# Patient Record
Sex: Female | Born: 1950
Health system: Southern US, Community
[De-identification: ages and names within clinical notes are randomized; demographics above are authoritative.]

## PROBLEM LIST (undated history)

## (undated) DIAGNOSIS — M4800 Spinal stenosis, site unspecified: Secondary | ICD-10-CM

## (undated) DIAGNOSIS — F32A Depression, unspecified: Secondary | ICD-10-CM

## (undated) DIAGNOSIS — F329 Major depressive disorder, single episode, unspecified: Secondary | ICD-10-CM

## (undated) DIAGNOSIS — M8580 Other specified disorders of bone density and structure, unspecified site: Secondary | ICD-10-CM

## (undated) HISTORY — PX: OTHER SURGICAL HISTORY: SHX169

## (undated) HISTORY — PX: TONSILLECTOMY: SUR1361

## (undated) HISTORY — DX: Major depressive disorder, single episode, unspecified: F32.9

## (undated) HISTORY — DX: Spinal stenosis, site unspecified: M48.00

## (undated) HISTORY — DX: Other specified disorders of bone density and structure, unspecified site: M85.80

## (undated) HISTORY — DX: Depression, unspecified: F32.A

---

## 1999-01-05 ENCOUNTER — Other Ambulatory Visit: Admission: RE | Admit: 1999-01-05 | Discharge: 1999-01-05 | Payer: Self-pay | Admitting: Internal Medicine

## 2000-02-29 ENCOUNTER — Encounter: Admission: RE | Admit: 2000-02-29 | Discharge: 2000-02-29 | Payer: Self-pay | Admitting: Internal Medicine

## 2000-02-29 ENCOUNTER — Encounter: Payer: Self-pay | Admitting: Internal Medicine

## 2001-01-30 ENCOUNTER — Other Ambulatory Visit: Admission: RE | Admit: 2001-01-30 | Discharge: 2001-01-30 | Payer: Self-pay | Admitting: Internal Medicine

## 2001-04-03 ENCOUNTER — Encounter: Payer: Self-pay | Admitting: Internal Medicine

## 2001-04-03 ENCOUNTER — Encounter: Admission: RE | Admit: 2001-04-03 | Discharge: 2001-04-03 | Payer: Self-pay | Admitting: Internal Medicine

## 2002-04-09 ENCOUNTER — Other Ambulatory Visit: Admission: RE | Admit: 2002-04-09 | Discharge: 2002-04-09 | Payer: Self-pay | Admitting: Internal Medicine

## 2002-04-23 ENCOUNTER — Encounter: Payer: Self-pay | Admitting: Internal Medicine

## 2002-04-23 ENCOUNTER — Encounter: Admission: RE | Admit: 2002-04-23 | Discharge: 2002-04-23 | Payer: Self-pay | Admitting: Internal Medicine

## 2002-12-17 ENCOUNTER — Encounter: Admission: RE | Admit: 2002-12-17 | Discharge: 2002-12-17 | Payer: Self-pay | Admitting: Internal Medicine

## 2002-12-17 ENCOUNTER — Encounter: Payer: Self-pay | Admitting: Internal Medicine

## 2003-05-06 ENCOUNTER — Encounter: Admission: RE | Admit: 2003-05-06 | Discharge: 2003-05-06 | Payer: Self-pay | Admitting: Internal Medicine

## 2003-05-06 ENCOUNTER — Other Ambulatory Visit: Admission: RE | Admit: 2003-05-06 | Discharge: 2003-05-06 | Payer: Self-pay | Admitting: Internal Medicine

## 2003-05-06 ENCOUNTER — Encounter: Payer: Self-pay | Admitting: Internal Medicine

## 2003-08-08 ENCOUNTER — Ambulatory Visit (HOSPITAL_COMMUNITY): Admission: RE | Admit: 2003-08-08 | Discharge: 2003-08-08 | Payer: Self-pay | Admitting: Internal Medicine

## 2004-06-08 ENCOUNTER — Encounter: Admission: RE | Admit: 2004-06-08 | Discharge: 2004-06-08 | Payer: Self-pay | Admitting: Internal Medicine

## 2004-06-29 ENCOUNTER — Encounter: Admission: RE | Admit: 2004-06-29 | Discharge: 2004-06-29 | Payer: Self-pay | Admitting: Internal Medicine

## 2005-06-21 ENCOUNTER — Encounter: Admission: RE | Admit: 2005-06-21 | Discharge: 2005-06-21 | Payer: Self-pay | Admitting: Internal Medicine

## 2005-09-06 ENCOUNTER — Other Ambulatory Visit: Admission: RE | Admit: 2005-09-06 | Discharge: 2005-09-06 | Payer: Self-pay | Admitting: Internal Medicine

## 2005-09-13 ENCOUNTER — Ambulatory Visit (HOSPITAL_COMMUNITY): Admission: RE | Admit: 2005-09-13 | Discharge: 2005-09-13 | Payer: Self-pay | Admitting: Internal Medicine

## 2005-10-18 ENCOUNTER — Encounter: Admission: RE | Admit: 2005-10-18 | Discharge: 2005-10-18 | Payer: Self-pay | Admitting: Internal Medicine

## 2006-07-04 ENCOUNTER — Encounter: Admission: RE | Admit: 2006-07-04 | Discharge: 2006-07-04 | Payer: Self-pay | Admitting: Internal Medicine

## 2006-10-24 ENCOUNTER — Other Ambulatory Visit: Admission: RE | Admit: 2006-10-24 | Discharge: 2006-10-24 | Payer: Self-pay | Admitting: Internal Medicine

## 2007-07-24 ENCOUNTER — Encounter: Admission: RE | Admit: 2007-07-24 | Discharge: 2007-07-24 | Payer: Self-pay | Admitting: Internal Medicine

## 2007-11-27 ENCOUNTER — Other Ambulatory Visit: Admission: RE | Admit: 2007-11-27 | Discharge: 2007-11-27 | Payer: Self-pay | Admitting: Internal Medicine

## 2007-12-04 ENCOUNTER — Encounter: Admission: RE | Admit: 2007-12-04 | Discharge: 2007-12-04 | Payer: Self-pay | Admitting: Internal Medicine

## 2008-08-12 ENCOUNTER — Encounter: Admission: RE | Admit: 2008-08-12 | Discharge: 2008-08-12 | Payer: Self-pay | Admitting: Internal Medicine

## 2008-10-28 ENCOUNTER — Ambulatory Visit: Payer: Self-pay | Admitting: Internal Medicine

## 2008-11-30 ENCOUNTER — Emergency Department (HOSPITAL_BASED_OUTPATIENT_CLINIC_OR_DEPARTMENT_OTHER): Admission: EM | Admit: 2008-11-30 | Discharge: 2008-11-30 | Payer: Self-pay | Admitting: Emergency Medicine

## 2008-12-01 ENCOUNTER — Encounter: Admission: RE | Admit: 2008-12-01 | Discharge: 2008-12-01 | Payer: Self-pay | Admitting: Sports Medicine

## 2008-12-01 ENCOUNTER — Emergency Department (HOSPITAL_BASED_OUTPATIENT_CLINIC_OR_DEPARTMENT_OTHER): Admission: EM | Admit: 2008-12-01 | Discharge: 2008-12-01 | Payer: Self-pay | Admitting: Emergency Medicine

## 2008-12-16 ENCOUNTER — Encounter: Admission: RE | Admit: 2008-12-16 | Discharge: 2008-12-16 | Payer: Self-pay | Admitting: Orthopedic Surgery

## 2009-05-19 ENCOUNTER — Ambulatory Visit: Payer: Self-pay | Admitting: Internal Medicine

## 2009-06-09 ENCOUNTER — Other Ambulatory Visit: Admission: RE | Admit: 2009-06-09 | Discharge: 2009-06-09 | Payer: Self-pay | Admitting: Internal Medicine

## 2009-06-09 ENCOUNTER — Ambulatory Visit: Payer: Self-pay | Admitting: Internal Medicine

## 2009-08-18 ENCOUNTER — Encounter: Admission: RE | Admit: 2009-08-18 | Discharge: 2009-08-18 | Payer: Self-pay | Admitting: Internal Medicine

## 2009-12-08 ENCOUNTER — Ambulatory Visit: Payer: Self-pay | Admitting: Internal Medicine

## 2010-08-31 ENCOUNTER — Encounter: Admission: RE | Admit: 2010-08-31 | Discharge: 2010-08-31 | Payer: Self-pay | Admitting: Internal Medicine

## 2010-09-14 ENCOUNTER — Ambulatory Visit: Payer: Self-pay | Admitting: Internal Medicine

## 2010-09-14 ENCOUNTER — Other Ambulatory Visit
Admission: RE | Admit: 2010-09-14 | Discharge: 2010-09-14 | Payer: Self-pay | Source: Home / Self Care | Admitting: Internal Medicine

## 2010-12-20 ENCOUNTER — Encounter: Payer: Self-pay | Admitting: Sports Medicine

## 2011-07-07 ENCOUNTER — Other Ambulatory Visit: Payer: Self-pay | Admitting: Internal Medicine

## 2011-08-31 ENCOUNTER — Other Ambulatory Visit: Payer: Self-pay | Admitting: Internal Medicine

## 2011-08-31 DIAGNOSIS — Z1231 Encounter for screening mammogram for malignant neoplasm of breast: Secondary | ICD-10-CM

## 2011-09-06 ENCOUNTER — Other Ambulatory Visit: Payer: Self-pay | Admitting: *Deleted

## 2011-09-06 MED ORDER — HYDROCODONE-ACETAMINOPHEN 5-500 MG PO TABS: 1.0000 | ORAL_TABLET | Freq: Four times a day (QID) | ORAL | Status: AC | PRN

## 2011-09-17 ENCOUNTER — Encounter: Payer: Self-pay | Admitting: Internal Medicine

## 2011-09-20 ENCOUNTER — Other Ambulatory Visit: Payer: BC Managed Care – HMO | Admitting: Internal Medicine

## 2011-09-20 ENCOUNTER — Ambulatory Visit
Admission: RE | Admit: 2011-09-20 | Discharge: 2011-09-20 | Disposition: A | Discharge status: Home / Self Care | Payer: BC Managed Care – HMO | Source: Ambulatory Visit | Attending: Internal Medicine | Admitting: Internal Medicine

## 2011-09-20 DIAGNOSIS — Z1231 Encounter for screening mammogram for malignant neoplasm of breast: Secondary | ICD-10-CM

## 2011-09-20 DIAGNOSIS — Z0000 Encounter for general adult medical examination without abnormal findings: Secondary | ICD-10-CM

## 2011-09-20 LAB — CBC WITH DIFFERENTIAL/PLATELET
Basophils Absolute: 0 10*3/uL (ref 0.0–0.1)
Basophils Relative: 0 % (ref 0–1)
Eosinophils Absolute: 0.1 10*3/uL (ref 0.0–0.7)
Lymphocytes Relative: 44 % (ref 12–46)
Lymphs Abs: 2.7 10*3/uL (ref 0.7–4.0)
MCH: 31.6 pg (ref 26.0–34.0)
MCHC: 32.5 g/dL (ref 30.0–36.0)
MCV: 97 fL (ref 78.0–100.0)
Neutrophils Relative %: 44 % (ref 43–77)
RBC: 4.34 MIL/uL (ref 3.87–5.11)

## 2011-09-20 LAB — TSH: TSH: 1.245 u[IU]/mL (ref 0.350–4.500)

## 2011-09-21 ENCOUNTER — Ambulatory Visit (INDEPENDENT_AMBULATORY_CARE_PROVIDER_SITE_OTHER): Payer: BC Managed Care – HMO | Admitting: Internal Medicine

## 2011-09-21 ENCOUNTER — Encounter: Payer: Self-pay | Admitting: Internal Medicine

## 2011-09-21 VITALS — BP 140/84 | HR 68 | Resp 18 | Ht 67.0 in | Wt 189.0 lb

## 2011-09-21 DIAGNOSIS — M4800 Spinal stenosis, site unspecified: Secondary | ICD-10-CM

## 2011-09-21 DIAGNOSIS — R232 Flushing: Secondary | ICD-10-CM

## 2011-09-21 DIAGNOSIS — Z87891 Personal history of nicotine dependence: Secondary | ICD-10-CM

## 2011-09-21 DIAGNOSIS — M8580 Other specified disorders of bone density and structure, unspecified site: Secondary | ICD-10-CM

## 2011-09-21 DIAGNOSIS — N951 Menopausal and female climacteric states: Secondary | ICD-10-CM

## 2011-09-21 DIAGNOSIS — M949 Disorder of cartilage, unspecified: Secondary | ICD-10-CM

## 2011-09-21 DIAGNOSIS — Z23 Encounter for immunization: Secondary | ICD-10-CM

## 2011-09-21 DIAGNOSIS — Z0000 Encounter for general adult medical examination without abnormal findings: Secondary | ICD-10-CM

## 2011-09-21 LAB — COMPREHENSIVE METABOLIC PANEL
AST: 20 U/L (ref 0–37)
Albumin: 4.5 g/dL (ref 3.5–5.2)
Alkaline Phosphatase: 42 U/L (ref 39–117)
BUN: 14 mg/dL (ref 6–23)
Calcium: 9.3 mg/dL (ref 8.4–10.5)
Chloride: 109 mEq/L (ref 96–112)
Glucose, Bld: 96 mg/dL (ref 70–99)
Potassium: 4.4 mEq/L (ref 3.5–5.3)
Sodium: 143 mEq/L (ref 135–145)
Total Protein: 6.4 g/dL (ref 6.0–8.3)

## 2011-09-21 LAB — LIPID PANEL
Cholesterol: 187 mg/dL (ref 0–200)
Total CHOL/HDL Ratio: 2.9 Ratio

## 2011-09-21 LAB — POCT URINALYSIS DIPSTICK
Glucose, UA: NEGATIVE
Ketones, UA: NEGATIVE
Leukocytes, UA: NEGATIVE

## 2011-09-21 MED ORDER — TETANUS-DIPHTH-ACELL PERTUSSIS 5-2.5-18.5 LF-MCG/0.5 IM SUSP: 0.5000 mL | Freq: Once | INTRAMUSCULAR | Status: DC

## 2011-09-24 ENCOUNTER — Other Ambulatory Visit: Payer: Self-pay | Admitting: Internal Medicine

## 2011-09-24 DIAGNOSIS — Z780 Asymptomatic menopausal state: Secondary | ICD-10-CM

## 2011-09-27 ENCOUNTER — Encounter: Payer: Self-pay | Admitting: Internal Medicine

## 2011-09-27 ENCOUNTER — Ambulatory Visit (INDEPENDENT_AMBULATORY_CARE_PROVIDER_SITE_OTHER): Payer: BC Managed Care – HMO | Admitting: Internal Medicine

## 2011-09-27 VITALS — BP 108/64 | HR 80 | Temp 98.4°F | Wt 188.0 lb

## 2011-09-27 DIAGNOSIS — M545 Low back pain: Secondary | ICD-10-CM

## 2011-09-27 NOTE — Progress Notes (Signed)
  Subjective:    Patient ID: Natalie Parker, female    DOB: September 25, 1951, 60 y.o.   MRN: [id number]  HPI Patient was away from work last week and spent some time cleaning out some closets. No frank heavy lifting but a fair amount of exercise for a few days. Now complaining of low back pain in the left lower back area for 48 hours. Difficult to get out of bed. Has been taking Vicodin sparingly but has to go back to work tomorrow and is concerned. No radiation of pain down left leg. No weakness in left leg.    Review of Systems     Objective:   Physical Exam straight leg raising is negative at 90 bilaterally. Deep tendon reflexes 1+ and equal in the knees absent in the ankles bilaterally. Muscle strength is 5 over 5 in the lower extremities. Has tenderness over left posterior superior iliac spine.        Assessment & Plan:  Low back strain  Plan: Sterapred DS 10 mg 12 day dosepak. Has Flexeril 10 mg at bedtime to take at bedtime. Has Vicodin 5/500 to take 1 by mouth every 6 hours when necessary pain. Call if not better in 7-10 days

## 2011-09-27 NOTE — Patient Instructions (Signed)
Apply heat or ice to lower back as needed. Take prednisone taper as prescribed for 12 days. Take Flexeril at bedtime and Vicodin sparingly for pain.

## 2011-09-29 ENCOUNTER — Encounter: Payer: Self-pay | Admitting: Internal Medicine

## 2011-09-29 DIAGNOSIS — Z87891 Personal history of nicotine dependence: Secondary | ICD-10-CM | POA: Insufficient documentation

## 2011-09-29 DIAGNOSIS — R232 Flushing: Secondary | ICD-10-CM | POA: Insufficient documentation

## 2011-09-29 DIAGNOSIS — M8580 Other specified disorders of bone density and structure, unspecified site: Secondary | ICD-10-CM | POA: Insufficient documentation

## 2011-09-29 DIAGNOSIS — M4800 Spinal stenosis, site unspecified: Secondary | ICD-10-CM | POA: Insufficient documentation

## 2011-09-29 NOTE — Patient Instructions (Signed)
Return one year or as needed. We will prescribed Vicodin for back pain. Please do not exceed more than 2-4 daily. Please consider stopping smoking.

## 2011-09-29 NOTE — Progress Notes (Signed)
  Subjective:    Patient ID: Natalie Parker, female    DOB: January 24, 1951, 60 y.o.   MRN: [id number]  HPI 60 year old white female here today for health maintenance. History of lumbar back pain, osteopenia, spinal stenosis, hot flashes. 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 Is taking Mobic in the past for back pain and takes Vicodin sparingly. Has Flexeril to take at bedtime as well.  Had tonsillectomy 1974 surgery for right knee meniscal tear April 2010. Last mammogram October 2011. Had colonoscopy 2004. Had hyperplastic polyp. Has taken Effexor for hot flashes and mood stabilization since 2011.  She is married. History of cigarette smoking about a half pack daily. Social alcohol consumption.  Father died at age 86 of a brain tumor. Has 2 brothers. And in and    Review of Systems  Constitutional: Positive for fatigue.  HENT: Negative.   Eyes: Negative.   Respiratory: Negative.   Cardiovascular: Negative.   Gastrointestinal: Negative.   Genitourinary: Negative.   Musculoskeletal: Positive for back pain and arthralgias.  Neurological: Negative.   Hematological: Negative.   Psychiatric/Behavioral: Negative.        Objective:   Physical Exam  Vitals reviewed. Constitutional: She is oriented to person, place, and time. She appears well-developed and well-nourished.  HENT:  Head: Normocephalic and atraumatic.  Right Ear: External ear normal.  Left Ear: External ear normal.  Mouth/Throat: Oropharynx is clear and moist.  Eyes: Conjunctivae and EOM are normal. Pupils are equal, round, and reactive to light. No scleral icterus.  Neck: Neck supple. No JVD present. No thyromegaly present.  Cardiovascular: Normal rate, regular rhythm and normal heart sounds.   Abdominal: Soft. Bowel sounds are normal. She exhibits no mass. There is no tenderness.  Musculoskeletal: Normal range of motion. She exhibits no edema.  Lymphadenopathy:    She has no cervical adenopathy.  Neurological: She  is alert and oriented to person, place, and time. She has normal reflexes. No cranial nerve deficit.  Skin: Skin is warm and dry.  Psychiatric: She has a normal mood and affect. Her behavior is normal.          Assessment & Plan:  History of low back pain and spinal stenosis  Osteopenia  Hot flashes  History of smoking  Plan: Return one year or as needed. Please consider stopping smoking.

## 2011-10-04 ENCOUNTER — Ambulatory Visit
Admission: RE | Admit: 2011-10-04 | Discharge: 2011-10-04 | Disposition: A | Discharge status: Home / Self Care | Payer: BC Managed Care – HMO | Source: Ambulatory Visit | Attending: Internal Medicine | Admitting: Internal Medicine

## 2011-10-04 DIAGNOSIS — Z780 Asymptomatic menopausal state: Secondary | ICD-10-CM

## 2012-01-22 ENCOUNTER — Other Ambulatory Visit: Payer: Self-pay | Admitting: Internal Medicine

## 2012-03-27 ENCOUNTER — Ambulatory Visit (INDEPENDENT_AMBULATORY_CARE_PROVIDER_SITE_OTHER): Payer: BC Managed Care – HMO | Admitting: Internal Medicine

## 2012-03-27 ENCOUNTER — Encounter: Payer: Self-pay | Admitting: Internal Medicine

## 2012-03-27 VITALS — BP 116/66 | HR 60 | Temp 98.4°F | Wt 192.0 lb

## 2012-03-27 DIAGNOSIS — Z87891 Personal history of nicotine dependence: Secondary | ICD-10-CM

## 2012-03-27 DIAGNOSIS — M25559 Pain in unspecified hip: Secondary | ICD-10-CM

## 2012-03-27 DIAGNOSIS — M48061 Spinal stenosis, lumbar region without neurogenic claudication: Secondary | ICD-10-CM

## 2012-03-27 DIAGNOSIS — M25551 Pain in right hip: Secondary | ICD-10-CM

## 2012-03-27 DIAGNOSIS — M899 Disorder of bone, unspecified: Secondary | ICD-10-CM

## 2012-03-27 DIAGNOSIS — M169 Osteoarthritis of hip, unspecified: Secondary | ICD-10-CM

## 2012-03-27 DIAGNOSIS — M8580 Other specified disorders of bone density and structure, unspecified site: Secondary | ICD-10-CM

## 2012-03-27 DIAGNOSIS — M160 Bilateral primary osteoarthritis of hip: Secondary | ICD-10-CM

## 2012-03-27 NOTE — Progress Notes (Signed)
  Subjective:    Patient ID: Comer Locket, female    DOB: 09/28/1951, 61 y.o.   MRN: [id number]  HPI For 6 month recheck. Has quit smoking as of April 2nd. Was smoking a pack q 3 days. Has started new diet. Back pain is stable. c/o bilateral hip pain. History of osteopenia and spinal stenosis. 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 Says both hips are hurting by the end of the day and this is concerning to her. Thinks she may have arthritis.    Review of Systems     Objective:   Physical Exam straight leg raising slightly positive on the left at 90 negative on the right. Muscle strength in the lower extremities is normal and deep tendon reflexes 2+ in the knees. Patient has some pain with internal rotation of both hips.        Assessment & Plan:  History of lumbar spinal stenosis  Osteoarthritis of hips  Plan: Continue with Vicodin sparingly for pain and have refilled Vicodin 5/500 #61 by mouth every 12 hours with 3 refills. Continue with calcium and vitamin D supplement for osteopenia. Bone density report November 2012 reviewed with her showed osteopenia. Return in 6 months for physical exam. Consider hip x-ray. May benefit from physical therapy.

## 2012-03-29 ENCOUNTER — Encounter: Payer: Self-pay | Admitting: Internal Medicine

## 2012-03-29 DIAGNOSIS — M25552 Pain in left hip: Secondary | ICD-10-CM | POA: Insufficient documentation

## 2012-03-29 NOTE — Patient Instructions (Signed)
Continue hydrocodone sparingly for musculoskeletal pain. Return in 6 months for physical exam. Continue calcium and vitamin D for osteopenia.

## 2012-06-15 ENCOUNTER — Ambulatory Visit (INDEPENDENT_AMBULATORY_CARE_PROVIDER_SITE_OTHER): Payer: BC Managed Care – HMO | Admitting: Internal Medicine

## 2012-06-15 ENCOUNTER — Encounter: Payer: Self-pay | Admitting: Internal Medicine

## 2012-06-15 VITALS — BP 122/76 | HR 64 | Temp 98.3°F | Ht 67.0 in | Wt 194.0 lb

## 2012-06-15 DIAGNOSIS — L02239 Carbuncle of trunk, unspecified: Secondary | ICD-10-CM

## 2012-06-19 ENCOUNTER — Ambulatory Visit: Payer: BC Managed Care – HMO | Admitting: Internal Medicine

## 2012-06-19 NOTE — Patient Instructions (Addendum)
Keep area clean warm and dry. Take doxycycline 100 mg twice daily for 10 days. Use warm hot compresses for 20 minutes twice daily on area. Call if not better in 3 days.

## 2012-06-19 NOTE — Progress Notes (Signed)
  Subjective:    Patient ID: Natalie Parker, female    DOB: 02-21-1951, 61 y.o.   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 Has not seen any drainage. She thought it looked a bit like a pimple. She was afraid to squeeze it. Says she's never had anything like this previously. No fever or malaise.    Review of Systems     Objective:   Physical Exam small erythematous nodule about the size of a pea below left breast. Pus was expressed from the area.        Assessment & Plan:  Carbuncle-trunk  Plan: Recommend warm hot compresses 20 minutes twice daily. Prescribed doxycycline 100 mg twice daily for 10 days. Call if not better in 3 days.

## 2012-08-21 ENCOUNTER — Ambulatory Visit (INDEPENDENT_AMBULATORY_CARE_PROVIDER_SITE_OTHER): Payer: BC Managed Care – HMO | Admitting: Internal Medicine

## 2012-08-21 ENCOUNTER — Encounter: Payer: Self-pay | Admitting: Internal Medicine

## 2012-08-21 VITALS — BP 110/58 | HR 76 | Temp 98.0°F | Ht 67.5 in | Wt 193.0 lb

## 2012-08-21 DIAGNOSIS — H6593 Unspecified nonsuppurative otitis media, bilateral: Secondary | ICD-10-CM

## 2012-08-21 DIAGNOSIS — J329 Chronic sinusitis, unspecified: Secondary | ICD-10-CM

## 2012-08-21 DIAGNOSIS — H6590 Unspecified nonsuppurative otitis media, unspecified ear: Secondary | ICD-10-CM

## 2012-08-21 NOTE — Progress Notes (Signed)
  Subjective:    Patient ID: Comer Locket, female    DOB: [birth year], 61 y.o.   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 Has nasal drainage that is not discolored. Some cough. Difficulty sleeping at night because of cough and congestion. Only slight sore throat. Has wedding to attend this coming weekend and  needs to get a lot of things done before then. No fever or shaking chills. No discolored sputum. Has taken Mucinex and Sudafed.      Review of Systems     Objective:   Physical Exam HEENT exam: TMs are full bilaterally but not red. Pharynx is slightly injected without exudate. Neck is supple without adenopathy. Chest clear to auscultation. Sounds slightly nasally congested. Skin warm and dry        Assessment & Plan:  Sinusitis  Bilateral serous otitis media  Plan: Hycodan 8 ounces 1 teaspoon by mouth every 6 hours when necessary cough. Levaquin 500 milligrams ( # 7) 1 by mouth daily with a meal with 1 refill. If not better in one week have prescription refilled.

## 2012-08-21 NOTE — Patient Instructions (Addendum)
Take Levaquin 500 milligrams daily for 7 days. If not better in 7 days have prescription refilled. Take Hycodan 1 teaspoon every 6 hours as needed for cough

## 2012-08-22 ENCOUNTER — Ambulatory Visit: Payer: BC Managed Care – HMO | Admitting: Internal Medicine

## 2012-09-25 ENCOUNTER — Other Ambulatory Visit: Payer: Self-pay | Admitting: Internal Medicine

## 2012-09-25 ENCOUNTER — Encounter: Payer: BC Managed Care – HMO | Admitting: Internal Medicine

## 2012-09-25 ENCOUNTER — Other Ambulatory Visit: Payer: BC Managed Care – HMO | Admitting: Internal Medicine

## 2012-09-25 DIAGNOSIS — Z1231 Encounter for screening mammogram for malignant neoplasm of breast: Secondary | ICD-10-CM

## 2012-10-09 ENCOUNTER — Encounter: Payer: Self-pay | Admitting: Internal Medicine

## 2012-10-09 ENCOUNTER — Other Ambulatory Visit (INDEPENDENT_AMBULATORY_CARE_PROVIDER_SITE_OTHER): Payer: BC Managed Care – HMO | Admitting: Internal Medicine

## 2012-10-09 ENCOUNTER — Ambulatory Visit (INDEPENDENT_AMBULATORY_CARE_PROVIDER_SITE_OTHER): Payer: BC Managed Care – HMO | Admitting: Internal Medicine

## 2012-10-09 VITALS — BP 102/72 | HR 68 | Temp 97.0°F | Ht 67.5 in | Wt 198.0 lb

## 2012-10-09 DIAGNOSIS — M19019 Primary osteoarthritis, unspecified shoulder: Secondary | ICD-10-CM

## 2012-10-09 DIAGNOSIS — Z87891 Personal history of nicotine dependence: Secondary | ICD-10-CM

## 2012-10-09 DIAGNOSIS — M899 Disorder of bone, unspecified: Secondary | ICD-10-CM

## 2012-10-09 DIAGNOSIS — Z0000 Encounter for general adult medical examination without abnormal findings: Secondary | ICD-10-CM

## 2012-10-09 DIAGNOSIS — Z23 Encounter for immunization: Secondary | ICD-10-CM

## 2012-10-09 DIAGNOSIS — G8929 Other chronic pain: Secondary | ICD-10-CM

## 2012-10-09 DIAGNOSIS — M7711 Lateral epicondylitis, right elbow: Secondary | ICD-10-CM

## 2012-10-09 DIAGNOSIS — M8580 Other specified disorders of bone density and structure, unspecified site: Secondary | ICD-10-CM

## 2012-10-09 DIAGNOSIS — F329 Major depressive disorder, single episode, unspecified: Secondary | ICD-10-CM

## 2012-10-09 DIAGNOSIS — M545 Low back pain: Secondary | ICD-10-CM

## 2012-10-09 DIAGNOSIS — M949 Disorder of cartilage, unspecified: Secondary | ICD-10-CM

## 2012-10-09 DIAGNOSIS — G4700 Insomnia, unspecified: Secondary | ICD-10-CM

## 2012-10-09 LAB — CBC WITH DIFFERENTIAL/PLATELET
Lymphocytes Relative: 46 % (ref 12–46)
Lymphs Abs: 2.1 10*3/uL (ref 0.7–4.0)
Neutro Abs: 1.9 10*3/uL (ref 1.7–7.7)
Neutrophils Relative %: 41 % — ABNORMAL LOW (ref 43–77)
Platelets: 242 10*3/uL (ref 150–400)
RBC: 4.25 MIL/uL (ref 3.87–5.11)
WBC: 4.5 10*3/uL (ref 4.0–10.5)

## 2012-10-09 LAB — LIPID PANEL
Cholesterol: 185 mg/dL (ref 0–200)
VLDL: 14 mg/dL (ref 0–40)

## 2012-10-09 LAB — COMPREHENSIVE METABOLIC PANEL
ALT: 16 U/L (ref 0–35)
AST: 21 U/L (ref 0–37)
CO2: 28 mEq/L (ref 19–32)
Calcium: 9.3 mg/dL (ref 8.4–10.5)
Chloride: 104 mEq/L (ref 96–112)
Sodium: 142 mEq/L (ref 135–145)
Total Protein: 6.5 g/dL (ref 6.0–8.3)

## 2012-10-09 LAB — POCT URINALYSIS DIPSTICK
Bilirubin, UA: NEGATIVE
Glucose, UA: NEGATIVE
Leukocytes, UA: NEGATIVE
Nitrite, UA: NEGATIVE
Urobilinogen, UA: NEGATIVE
pH, UA: 7.5

## 2012-10-09 LAB — TSH: TSH: 1.294 u[IU]/mL (ref 0.350–4.500)

## 2012-10-09 NOTE — Progress Notes (Signed)
Subjective:    Patient ID: Natalie Parker, female    DOB: 01-08-1951, 61 y.o.   MRN: [id number]  HPI  61 year-old white female comes in today for health maintenance and evaluation of several medical problems. History of lumbar back pain, osteopenia, spinal stenosis, hot flashes. 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 He problems include right elbow and shoulder pain. She uses blow dryer with her right hand but is actually left-handed. Also has had considerable issues with hot flashes and insomnia. She tried Effexor but says it causes a headache. She has taken Flexeril at bedtime previously. She is on Mobic for musculoskeletal pain. She takes Vicodin sparingly.  Says she is depressed. Says she's irritable. Says these issues around her not feeling well and     hurting. 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 Doesn't plan to retire for couple of years. We took her off of estrogen replacement because she had been on it for many years. Says that she has a lot of issues with hot flashes and insomnia. Says she's been fatigued and worried. Will not say exactly why she is worried other than pain and getting old  History of smoking cigarettes half pack daily. Social alcohol consumption.  Father died at age 46 of a brain tumor. Has 2 brothers in good health. No children.  Tonsillectomy 1974. Surgery for right knee meniscal tear April 2010. Gets annual mammogram. History of osteopenia. Had colonoscopy by Dr. Juanda Chance in 2004. History of hyperplastic polyps. Review of Systems  Constitutional: Positive for fatigue.  HENT: Negative.   Eyes: Negative.   Genitourinary:       Hot flashes  Musculoskeletal: Positive for back pain.       Right elbow and right shoulder benign  Neurological: Negative.   Psychiatric/Behavioral: Positive for sleep disturbance and dysphoric mood.       Objective:   Physical Exam  Vitals reviewed. Constitutional: She is oriented to person, place, and time. She appears  well-developed and well-nourished. No distress.  HENT:  Head: Normocephalic and atraumatic.  Right Ear: External ear normal.  Left Ear: External ear normal.  Mouth/Throat: Oropharynx is clear and moist. No oropharyngeal exudate.  Eyes: Conjunctivae normal and EOM are normal. Pupils are equal, round, and reactive to light. Right eye exhibits no discharge. Left eye exhibits no discharge.  Neck: Neck supple. No JVD present. No thyromegaly present.  Cardiovascular: Normal rate, regular rhythm and normal heart sounds.   No murmur heard. Pulmonary/Chest: Effort normal and breath sounds normal. No respiratory distress. She has no wheezes. She has no rales.       Breasts normal female without masses  Abdominal: Soft. Bowel sounds are normal. She exhibits no distension and no mass. There is no tenderness. There is no rebound and no guarding.  Genitourinary:       Pap not taken. Bimanual exam normal  Musculoskeletal: Normal range of motion. She exhibits no edema.       Tender right lateral epicondyle. Tender anterior glenohumeral joint on right. Normal muscle strength in the right upper extremity and deep tendon reflexes in the right upper extremity are normal  Lymphadenopathy:    She has no cervical adenopathy.  Neurological: She is alert and oriented to person, place, and time. She has normal reflexes. No cranial nerve deficit. Coordination normal.  Skin: Skin is warm and dry. No rash noted. She is not diaphoretic.  Psychiatric: Judgment and thought content normal.  Tearful in the office. Affect is flat.          Assessment & Plan:   long-standing history of back pain related to spinal stenosis  Right lateral epicondylitis  Right shoulder arthropathy  Depression  Hot flashes  Insomnia  Plan: Prempro 0.625 mg/5 mg daily. Start Celexa 20 mg daily. Return in 4 weeks for reevaluation. Fasting labs are drawn and are pending including TSH. Continue Vicodin sparingly for pain. She is  on Mobic for musculoskeletal pain. Appointment with hand surgeon to look at right upper extremity. Is not ready to quit smoking.

## 2012-10-10 ENCOUNTER — Encounter: Payer: Self-pay | Admitting: Internal Medicine

## 2012-10-10 DIAGNOSIS — F329 Major depressive disorder, single episode, unspecified: Secondary | ICD-10-CM | POA: Insufficient documentation

## 2012-10-10 DIAGNOSIS — G4700 Insomnia, unspecified: Secondary | ICD-10-CM | POA: Insufficient documentation

## 2012-10-10 LAB — VITAMIN D 25 HYDROXY (VIT D DEFICIENCY, FRACTURES): Vit D, 25-Hydroxy: 28 ng/mL — ABNORMAL LOW (ref 30–89)

## 2012-10-10 NOTE — Patient Instructions (Addendum)
Start Celexa 20 mg daily. Start Prempro for estrogen replacement. Return in 4 weeks. See hand surgeon for right shoulder and right arm pain.

## 2012-10-10 NOTE — Patient Instructions (Addendum)
Takes Celexa 20 mg daily. Takes Prempro daily. Return in 4 weeks. See hand surgeon for shoulder problems and arm pain

## 2012-10-10 NOTE — Progress Notes (Signed)
Subjective:    Patient ID: Natalie Parker, female    DOB: 02-May-1951, 61 y.o.   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 Used to take Prempro and Activella but I got her to discontinue those a few years ago. She took Fosamax for 2 years but discontinued in 2011. Had bone density study in 2006 that showed a T score in the LS spine of -1.5 and a T score in the left hip of -0.1. Last mammogram on file October 2011. Patient takes Mobic for musculoskeletal pain. She also takes Vicodin sparingly for pain. She had surgery for right meniscal tear in April 2010. Tonsillectomy 1974.  She has been a Interior and spatial designer for 39 years. Doesn't want to retire for another couple of years. Has no children. Is married. Completed the 12th grade. Has smoked a half-pack of cigarettes a day for over 30 years. Is on her feet a number of hours a week. Had a D&C status post miscarriage in November 1994.  Patient admits to being depressed. Says she worries about growing older. Social alcohol consumption.   As did the S1 selective nerve root block on the right in September 2011 and August 2010  Family history: Mother is deceased. Father died at age 68 of a brain tumor. 2 brothers in good health.  Patient saw GYN physician in 2009 for an episode of uterine bleeding while on Activella. She discontinued hormone replacement therapy. Endometrial biopsy was negative. Ultrasound was negative.  Patient says she is suffering from insomnia and hot flashes. We'll to go back on estrogen replacement., Bit reluctant about this but we're going to try it for a couple of months.  She's having considerable right arm and shoulder pain. She is left handed but uses a blow dryer with her right upper extremity. Says it's difficult to use a blow dryer because of pain in her right lateral epicondyle and right  shoulder.    Review of Systems  HENT: Negative.   Eyes: Negative.   Respiratory: Negative.   Genitourinary: Negative.   Musculoskeletal: Positive for back pain and arthralgias.       Right shoulder pain, right elbow pain  Neurological: Negative.   Hematological: Negative.   Psychiatric/Behavioral: Positive for dysphoric mood.       Objective:   Physical Exam  Nursing note and vitals reviewed. Constitutional: She is oriented to person, place, and time. She appears well-developed and well-nourished. No distress.  HENT:  Head: Normocephalic and atraumatic.  Right Ear: External ear normal.  Left Ear: External ear normal.  Mouth/Throat: Oropharynx is clear and moist. No oropharyngeal exudate.  Eyes: Conjunctivae normal are normal. Pupils are equal, round, and reactive to light. Right eye exhibits no discharge. Left eye exhibits no discharge. No scleral icterus.  Neck: Neck supple. No JVD present. No thyromegaly present.  Cardiovascular: Normal rate, regular rhythm, normal heart sounds and intact distal pulses.   No murmur heard. Pulmonary/Chest: Effort normal and breath sounds normal. No respiratory distress. She has no wheezes. She has no rales. She exhibits no tenderness.       Breasts normal female  Abdominal: Soft. Bowel sounds are normal. She exhibits no distension and no mass. There is no tenderness. There is no rebound and no guarding.  Genitourinary:       Bimanual exam normal. Last Pap smear 09/15/2010  Musculoskeletal: Normal range of motion. She exhibits no  edema.       Tender right shoulder and right lateral epicondyle  Lymphadenopathy:    She has no cervical adenopathy.  Neurological: She is alert and oriented to person, place, and time. She displays normal reflexes. No cranial nerve deficit. Coordination normal.  Skin: Skin is warm and dry. She is not diaphoretic.  Psychiatric:       Flat affect          Assessment & Plan:   Right lateral  epicondylitis  Right shoulder arthropathy  Depression  Hot flashes  Insomnia  History of chronic back pain secondary spinal stenosis  Osteopenia  History smoking  Plan: Patient will start Celexa 20 mg daily. We'll start Prempro 0.625 mg/5 mg daily. Reevaluate in 4-6 weeks. She'll see hand surgeon regarding shoulder pain and elbow pain.  Fasting labs drawn and are pending including TSH. Needs to have mammogram. May need GYN consultation regarding estrogen replacement.

## 2012-10-12 ENCOUNTER — Telehealth: Payer: Self-pay

## 2012-10-12 NOTE — Telephone Encounter (Signed)
Patient scheduled for an appointment with Dr. Amanda Pea on November 30, 2012 at 8:15 am. Notified of this

## 2012-10-30 ENCOUNTER — Ambulatory Visit
Admission: RE | Admit: 2012-10-30 | Discharge: 2012-10-30 | Disposition: A | Discharge status: Home / Self Care | Payer: BC Managed Care – PPO | Source: Ambulatory Visit | Attending: Internal Medicine | Admitting: Internal Medicine

## 2012-10-30 DIAGNOSIS — Z1231 Encounter for screening mammogram for malignant neoplasm of breast: Secondary | ICD-10-CM

## 2012-11-06 ENCOUNTER — Other Ambulatory Visit: Payer: Self-pay | Admitting: Internal Medicine

## 2012-11-06 ENCOUNTER — Other Ambulatory Visit: Payer: Self-pay

## 2012-11-13 ENCOUNTER — Encounter: Payer: Self-pay | Admitting: Internal Medicine

## 2012-11-13 ENCOUNTER — Ambulatory Visit (INDEPENDENT_AMBULATORY_CARE_PROVIDER_SITE_OTHER): Payer: BC Managed Care – PPO | Admitting: Internal Medicine

## 2012-11-13 VITALS — BP 98/62 | HR 72 | Temp 98.6°F | Wt 201.0 lb

## 2012-11-13 DIAGNOSIS — M7918 Myalgia, other site: Secondary | ICD-10-CM

## 2012-11-13 DIAGNOSIS — R232 Flushing: Secondary | ICD-10-CM

## 2012-11-13 DIAGNOSIS — N951 Menopausal and female climacteric states: Secondary | ICD-10-CM

## 2012-11-13 DIAGNOSIS — IMO0001 Reserved for inherently not codable concepts without codable children: Secondary | ICD-10-CM

## 2012-11-13 DIAGNOSIS — F329 Major depressive disorder, single episode, unspecified: Secondary | ICD-10-CM

## 2012-11-13 NOTE — Patient Instructions (Addendum)
Continue Celexa and Prempro. Take Vicodin sparingly. Return in 6 months

## 2012-11-13 NOTE — Progress Notes (Signed)
  Subjective:    Patient ID: Natalie Parker, female    DOB: November 03, 1951, 61 y.o.   MRN: [id number]  HPI One-month followup on depression, musculoskeletal pain and estrogen replacement. Last visit was started on Prempro. So far doing well with that. Feels better. Celexa has helped depression symptoms. Still has musculoskeletal pain in her shoulders. Has Vicodin on hand to take for that. 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 Overall she is more cheerful and less irritable.    Review of Systems     Objective:   Physical Exam spoke with patient today for about 15 minutes. Not examined. Affect is much brighter.        Assessment & Plan:  Depression-improved on Celexa  Vasomotor symptoms consistent with menopause-improved with Prempro  Fatigue  Musculoskeletal pain-treated with Vicodin sparingly  Plan: Return as needed or in 6 months

## 2013-01-15 ENCOUNTER — Other Ambulatory Visit: Payer: Self-pay | Admitting: Internal Medicine

## 2013-03-11 ENCOUNTER — Other Ambulatory Visit: Payer: Self-pay | Admitting: Internal Medicine

## 2013-03-11 NOTE — Telephone Encounter (Signed)
Refill for 6 months. 

## 2013-03-12 ENCOUNTER — Other Ambulatory Visit: Payer: Self-pay

## 2013-03-19 ENCOUNTER — Encounter: Payer: Self-pay | Admitting: Internal Medicine

## 2013-03-19 ENCOUNTER — Ambulatory Visit (INDEPENDENT_AMBULATORY_CARE_PROVIDER_SITE_OTHER): Payer: BC Managed Care – PPO | Admitting: Internal Medicine

## 2013-03-19 VITALS — BP 106/74 | HR 72 | Temp 98.4°F | Wt 202.0 lb

## 2013-03-19 DIAGNOSIS — IMO0002 Reserved for concepts with insufficient information to code with codable children: Secondary | ICD-10-CM

## 2013-03-19 DIAGNOSIS — M5416 Radiculopathy, lumbar region: Secondary | ICD-10-CM

## 2013-03-19 MED ORDER — HYDROCODONE-ACETAMINOPHEN 5-325 MG PO TABS: 1.0000 | ORAL_TABLET | Freq: Three times a day (TID) | ORAL | Status: AC | PRN

## 2013-03-19 NOTE — Progress Notes (Signed)
  Subjective:    Patient ID: Natalie Parker, female    DOB: 10-Apr-1951, 62 y.o.   MRN: [id number]  HPI  62 year old white female with history of right lumbar radiculopathy. Last MRI on file 2010. At that time she had nerve root encroachment L2-L3 L3-L4 and L4-L5 on the right. Has been having recurrent back pain for about a week. Has seen Dr. Ethelene Hal in the past  at [facility] for epidural steroid injections. However hasn't been there in about 16 months and they would not see her without referral from primary care. We will arrange for her to see Dr. Ethelene Hal. Having pain in right buttock radiating down to right leg and knee. 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 Says pain medication was not refilled at [facility] although our records indicate hydrocodone/APAP was refilled on  April 14. Has been taking up to 3 hydrocodone tablets daily because of pain. Is also on Mobic. Has run out of Flexeril.      Review of Systems     Objective:   Physical Exam  Straight leg raising is positive at 90 on the right. Muscle strength is normal in the right lower extremity. Has pain of the right posterior superior iliac spine. Deep tendon reflexes in the knees are absent. Patient is in considerable amount of pain and walks with a limp.        Assessment & Plan:  Right lower extremity radiculopathy  History of lumbar disc protrusions causing nerve root encroachment at multiple levels based on MRI 2010  Plan: Continue generic Mobic. Take Flexeril 10 mg at bedtime, Sterapred DS 10 mg 6 day dosepak. Appointment with Dr. Ethelene Hal. Refill hydrocodone/APAP 5/325 #90 with with 3 refills. She is to take 1 by mouth 3 times a day for now and then cut back gradually. We'll see Dr. Ethelene Hal Monday Apr 03, 2063.

## 2013-03-19 NOTE — Patient Instructions (Addendum)
Take Hydrocodone APAP 3 times daily. Take Prednisone as directed. We will get appt with Dr. Ethelene Hal.  Appt is Monday may 5 with Dr. Ethelene Hal.

## 2013-03-27 ENCOUNTER — Telehealth: Payer: Self-pay

## 2013-03-27 NOTE — Telephone Encounter (Signed)
Ask her to call Natalie Parker at [phone] to be seen acutely by Ortho since Dr. Ethelene Hal does not have appt until May 5th.. Perhaps they can see her and order any tests they think are appropriate.

## 2013-03-27 NOTE — Telephone Encounter (Signed)
Patient informed. 

## 2013-03-27 NOTE — Telephone Encounter (Signed)
Was seen here last week for back pain. On Prednisone dosepak and Vicodin, but says pain med not touching the pain. Has an appointment with an orthopedic May 5, but is hurting bad enough that she is considering going to the ED. Feels like a pinched nerve

## 2013-04-02 ENCOUNTER — Other Ambulatory Visit: Payer: Self-pay | Admitting: Internal Medicine

## 2013-04-14 ENCOUNTER — Other Ambulatory Visit: Payer: Self-pay | Admitting: Internal Medicine

## 2013-05-07 ENCOUNTER — Other Ambulatory Visit: Payer: Self-pay | Admitting: Internal Medicine

## 2013-05-18 ENCOUNTER — Encounter (HOSPITAL_BASED_OUTPATIENT_CLINIC_OR_DEPARTMENT_OTHER): Payer: Self-pay | Admitting: Emergency Medicine

## 2013-05-18 ENCOUNTER — Emergency Department (HOSPITAL_BASED_OUTPATIENT_CLINIC_OR_DEPARTMENT_OTHER)
Admission: EM | Admit: 2013-05-18 | Discharge: 2013-05-18 | Disposition: A | Discharge status: Home / Self Care | Payer: BC Managed Care – PPO | Attending: Emergency Medicine | Admitting: Emergency Medicine

## 2013-05-18 DIAGNOSIS — Z87891 Personal history of nicotine dependence: Secondary | ICD-10-CM | POA: Insufficient documentation

## 2013-05-18 DIAGNOSIS — F3289 Other specified depressive episodes: Secondary | ICD-10-CM | POA: Insufficient documentation

## 2013-05-18 DIAGNOSIS — M5416 Radiculopathy, lumbar region: Secondary | ICD-10-CM

## 2013-05-18 DIAGNOSIS — F329 Major depressive disorder, single episode, unspecified: Secondary | ICD-10-CM | POA: Insufficient documentation

## 2013-05-18 DIAGNOSIS — M899 Disorder of bone, unspecified: Secondary | ICD-10-CM | POA: Insufficient documentation

## 2013-05-18 DIAGNOSIS — Z8739 Personal history of other diseases of the musculoskeletal system and connective tissue: Secondary | ICD-10-CM | POA: Insufficient documentation

## 2013-05-18 DIAGNOSIS — Z79899 Other long term (current) drug therapy: Secondary | ICD-10-CM | POA: Insufficient documentation

## 2013-05-18 DIAGNOSIS — IMO0002 Reserved for concepts with insufficient information to code with codable children: Secondary | ICD-10-CM | POA: Insufficient documentation

## 2013-05-18 MED ORDER — DEXAMETHASONE SODIUM PHOSPHATE 10 MG/ML IJ SOLN
10.0000 mg | Freq: Once | INTRAMUSCULAR | Status: AC
  Administered 2013-05-18: 10 mg via INTRAMUSCULAR
  Filled 2013-05-18: qty 1

## 2013-05-18 MED ORDER — METHOCARBAMOL 750 MG PO TABS: 750.0000 mg | ORAL_TABLET | Freq: Three times a day (TID) | ORAL | Status: DC

## 2013-05-18 MED ORDER — KETOROLAC TROMETHAMINE 60 MG/2ML IM SOLN
60.0000 mg | Freq: Once | INTRAMUSCULAR | Status: AC
  Administered 2013-05-18: 60 mg via INTRAMUSCULAR
  Filled 2013-05-18: qty 2

## 2013-05-18 MED ORDER — OXYCODONE-ACETAMINOPHEN 5-325 MG PO TABS
2.0000 | ORAL_TABLET | Freq: Once | ORAL | Status: AC
  Administered 2013-05-18: 2 via ORAL
  Filled 2013-05-18 (×2): qty 2

## 2013-05-18 MED ORDER — GABAPENTIN 100 MG PO CAPS: 100.0000 mg | ORAL_CAPSULE | Freq: Three times a day (TID) | ORAL | Status: DC

## 2013-05-18 MED ORDER — PREDNISONE 20 MG PO TABS: ORAL_TABLET | ORAL | Status: DC

## 2013-05-18 MED ORDER — METHOCARBAMOL 500 MG PO TABS
1000.0000 mg | ORAL_TABLET | Freq: Once | ORAL | Status: AC
  Administered 2013-05-18: 1000 mg via ORAL
  Filled 2013-05-18: qty 2

## 2013-05-18 NOTE — ED Provider Notes (Signed)
History     CSN: [id number]  Arrival date & time 05/18/13  4098   First MD Initiated Contact with Patient 05/18/13 [phone]      Chief Complaint  Patient presents with  . Back Pain    (Consider location/radiation/quality/duration/timing/severity/associated sxs/prior treatment) Patient is a 62 y.o. female presenting with back pain. The history is provided by the patient. No language interpreter was used.  Back Pain Location:  Lumbar spine Quality:  Shooting Radiates to:  R posterior upper leg Pain severity:  Severe Pain is:  Same all the time Onset quality:  Gradual Timing:  Constant Progression:  Worsening Chronicity:  Recurrent Context: not recent injury   Relieved by:  Nothing Worsened by:  Nothing tried Ineffective treatments:  None tried Associated symptoms: no abdominal pain, no abdominal swelling, no bladder incontinence, no bowel incontinence, no chest pain, no dysuria, no fever, no headaches, no numbness, no paresthesias, no pelvic pain, no perianal numbness, no tingling, no weakness and no weight loss   Risk factors: no vascular disease   Patient reports being seen by Dr. Ethelene Hal on 04/06/13 for epidural which initially worked and patient is now having increased pain again.  No f/c/r.  No trauma.  States that the norco prescribed by her family doctor have not been working.  States she had recent plain films by Dr. Ethelene Hal' PA and those were reportedly normal.    Past Medical History  Diagnosis Date  . Depression   . Osteopenia   . Spinal stenosis     Past Surgical History  Procedure Laterality Date  . Tonsillectomy    . Repair torn meniscus      History reviewed. No pertinent family history.  History  Substance Use Topics  . Smoking status: Former Smoker -- 0.50 packs/day for 20 years    Types: Cigarettes    Quit date: 02/29/2012  . Smokeless tobacco: Never Used  . Alcohol Use: Yes     Comment: occasional    OB History   Grav Para Term Preterm Abortions TAB  SAB Ect Mult Living                  Review of Systems  Constitutional: Negative for fever and weight loss.  Cardiovascular: Negative for chest pain.  Gastrointestinal: Negative for abdominal pain and bowel incontinence.  Genitourinary: Negative for bladder incontinence, dysuria, difficulty urinating and pelvic pain.  Musculoskeletal: Positive for back pain.  Neurological: Negative for tingling, weakness, numbness, headaches and paresthesias.  All other systems reviewed and are negative.    Allergies  E-mycin  Home Medications   Current Outpatient Rx  Name  Route  Sig  Dispense  Refill  . citalopram (CELEXA) 20 MG tablet      TAKE 1 TABLET EVERY DAY   30 tablet   2   . cyclobenzaprine (FLEXERIL) 10 MG tablet   Oral   Take 10 mg by mouth 3 (three) times daily as needed.           Marland Kitchen HYDROcodone-acetaminophen (NORCO/VICODIN) 5-325 MG per tablet   Oral   Take 1 tablet by mouth every 8 (eight) hours as needed for pain.   90 tablet   3   . meloxicam (MOBIC) 15 MG tablet      TAKE 1 TABLET BY MOUTH EVERY DAY   30 tablet   5   . oxyCODONE-acetaminophen (PERCOCET) 10-325 MG per tablet   Oral   Take 1 tablet by mouth every 4 (four) hours as needed  for pain.         . calcium carbonate (TUMS EX) 750 MG chewable tablet   Oral   Chew 1 tablet by mouth daily.           . cyanocobalamin 100 MCG tablet   Oral   Take 100 mcg by mouth daily.           Marland Kitchen HYDROcodone-homatropine (HYCODAN) 5-1.5 MG/5ML syrup               . Multiple Vitamin (MULTIVITAMIN) tablet   Oral   Take 1 tablet by mouth daily.           Marland Kitchen PREMPRO 0.625-5 MG per tablet      TAKE 1 TABLET EVERY DAY   28 tablet   5     BP 115/60  Pulse 69  Resp 18  SpO2 99%  Physical Exam  Constitutional: She is oriented to person, place, and time. She appears well-developed and well-nourished. No distress.  HENT:  Head: Normocephalic and atraumatic.  Mouth/Throat: Oropharynx is clear and  moist.  Eyes: Conjunctivae are normal. Pupils are equal, round, and reactive to light.  Neck: Normal range of motion. Neck supple.  Cardiovascular: Normal rate, regular rhythm and intact distal pulses.   Pulmonary/Chest: Effort normal and breath sounds normal. She has no wheezes. She has no rales.  Abdominal: Soft. Bowel sounds are normal. There is no tenderness. There is no rebound and no guarding.  Musculoskeletal: Normal range of motion. She exhibits no edema and no tenderness.  Neurological: She is alert and oriented to person, place, and time. She has normal reflexes.  Intact L5/s1   Skin: Skin is warm and dry.  Psychiatric: She has a normal mood and affect.    ED Course  Procedures (including critical care time)  Labs Reviewed - No data to display No results found.   No diagnosis found.    MDM  IM toradol, dexamethasone, percocet and robaxin 1000 mg PO.  Will change muscle relaxant to robaxin BID, and prescribe short course of oral steroids.  Patient currently has prescriptions for meloxicam and norco.  Follow up with Dr. Ethelene Hal for close follow up.         Jasmine Awe, MD 05/18/13 [phone]

## 2013-05-18 NOTE — ED Notes (Signed)
H/o back pain, had epidural injection may 9th, pain resolved, in last 48 hr pt has developed severe pain radiating down right leg

## 2013-08-08 ENCOUNTER — Other Ambulatory Visit: Payer: Self-pay | Admitting: Internal Medicine

## 2013-08-09 ENCOUNTER — Other Ambulatory Visit: Payer: Self-pay | Admitting: Internal Medicine

## 2013-08-20 ENCOUNTER — Ambulatory Visit (INDEPENDENT_AMBULATORY_CARE_PROVIDER_SITE_OTHER): Payer: BC Managed Care – PPO | Admitting: Internal Medicine

## 2013-08-20 ENCOUNTER — Encounter: Payer: Self-pay | Admitting: Internal Medicine

## 2013-08-20 VITALS — BP 114/72 | Temp 98.9°F | Wt 211.0 lb

## 2013-08-20 DIAGNOSIS — H6691 Otitis media, unspecified, right ear: Secondary | ICD-10-CM

## 2013-08-20 DIAGNOSIS — H6690 Otitis media, unspecified, unspecified ear: Secondary | ICD-10-CM

## 2013-08-20 DIAGNOSIS — J069 Acute upper respiratory infection, unspecified: Secondary | ICD-10-CM

## 2013-08-20 MED ORDER — LEVOFLOXACIN 500 MG PO TABS: 500.0000 mg | ORAL_TABLET | Freq: Every day | ORAL | Status: DC

## 2013-08-20 NOTE — Patient Instructions (Addendum)
Take Levaquin 500 milligrams daily for 7 days. If not better in 7 days have prescription refilled

## 2013-08-20 NOTE — Progress Notes (Signed)
  Subjective:    Patient ID: Natalie Parker, female    DOB: February 15, 1951, 62 y.o.   MRN: [id number]  HPI  62 year old female with complaint of right ear pain and right maxillary sinus pressure onset over the weekend. No fever or shaking chills. No cough. Seeing Dr. Ethelene Hal for chronic back pain. He has prescribed  Hydrocodone/APAP for her. She has 2 written prescriptions for him. She is aware of the new regulations regarding hydrocodone not being called and as of October 6.    Review of Systems     Objective:   Physical Exam Pharynx is clear. Right TM is full and pain. Left TM is clear. Neck is supple without adenopathy. Chest clear to auscultation.        Assessment & Plan:  Acute upper respiratory infection  Acute right otitis media  Plan: Levaquin 500 milligrams daily for 7 days with one refill. Not better in 7 days have prescription refilled.

## 2013-09-09 ENCOUNTER — Other Ambulatory Visit: Payer: Self-pay | Admitting: Internal Medicine

## 2013-09-24 ENCOUNTER — Encounter: Payer: BC Managed Care – PPO | Admitting: Internal Medicine

## 2013-09-24 DIAGNOSIS — M8580 Other specified disorders of bone density and structure, unspecified site: Secondary | ICD-10-CM

## 2013-09-24 DIAGNOSIS — Z13220 Encounter for screening for lipoid disorders: Secondary | ICD-10-CM

## 2013-09-24 DIAGNOSIS — Z0000 Encounter for general adult medical examination without abnormal findings: Secondary | ICD-10-CM

## 2013-09-24 DIAGNOSIS — Z1329 Encounter for screening for other suspected endocrine disorder: Secondary | ICD-10-CM

## 2013-09-24 DIAGNOSIS — Z130 Encounter for screening for diseases of the blood and blood-forming organs and certain disorders involving the immune mechanism: Secondary | ICD-10-CM

## 2013-09-24 LAB — CBC WITH DIFFERENTIAL/PLATELET
Basophils Absolute: 0 10*3/uL (ref 0.0–0.1)
Eosinophils Relative: 2 % (ref 0–5)
HCT: 38.8 % (ref 36.0–46.0)
Hemoglobin: 13.4 g/dL (ref 12.0–15.0)
Lymphocytes Relative: 41 % (ref 12–46)
MCHC: 34.5 g/dL (ref 30.0–36.0)
MCV: 94.2 fL (ref 78.0–100.0)
Monocytes Absolute: 0.5 10*3/uL (ref 0.1–1.0)
Monocytes Relative: 10 % (ref 3–12)
Neutro Abs: 2.3 10*3/uL (ref 1.7–7.7)
RDW: 13 % (ref 11.5–15.5)
WBC: 4.8 10*3/uL (ref 4.0–10.5)

## 2013-09-24 LAB — COMPREHENSIVE METABOLIC PANEL
ALT: 15 U/L (ref 0–35)
AST: 21 U/L (ref 0–37)
BUN: 13 mg/dL (ref 6–23)
Calcium: 9.2 mg/dL (ref 8.4–10.5)
Creat: 0.68 mg/dL (ref 0.50–1.10)
Total Bilirubin: 0.5 mg/dL (ref 0.3–1.2)

## 2013-09-24 LAB — LIPID PANEL
Cholesterol: 171 mg/dL (ref 0–200)
HDL: 49 mg/dL (ref >39)
Total CHOL/HDL Ratio: 3.5 Ratio
VLDL: 15 mg/dL (ref 0–40)

## 2013-09-25 LAB — VITAMIN D 25 HYDROXY (VIT D DEFICIENCY, FRACTURES): Vit D, 25-Hydroxy: 57 ng/mL (ref 30–89)

## 2013-10-03 ENCOUNTER — Other Ambulatory Visit: Payer: Self-pay

## 2013-10-03 DIAGNOSIS — Z1231 Encounter for screening mammogram for malignant neoplasm of breast: Secondary | ICD-10-CM

## 2013-10-29 ENCOUNTER — Ambulatory Visit (INDEPENDENT_AMBULATORY_CARE_PROVIDER_SITE_OTHER): Payer: BC Managed Care – PPO | Admitting: Internal Medicine

## 2013-10-29 ENCOUNTER — Other Ambulatory Visit (HOSPITAL_COMMUNITY)
Admission: RE | Admit: 2013-10-29 | Discharge: 2013-10-29 | Disposition: A | Discharge status: Home / Self Care | Payer: BC Managed Care – PPO | Source: Ambulatory Visit | Attending: Internal Medicine | Admitting: Internal Medicine

## 2013-10-29 ENCOUNTER — Encounter: Payer: Self-pay | Admitting: Internal Medicine

## 2013-10-29 VITALS — BP 110/76 | HR 64 | Temp 98.7°F | Ht 67.5 in | Wt 207.0 lb

## 2013-10-29 DIAGNOSIS — Z8659 Personal history of other mental and behavioral disorders: Secondary | ICD-10-CM

## 2013-10-29 DIAGNOSIS — M8580 Other specified disorders of bone density and structure, unspecified site: Secondary | ICD-10-CM

## 2013-10-29 DIAGNOSIS — Z0000 Encounter for general adult medical examination without abnormal findings: Secondary | ICD-10-CM

## 2013-10-29 DIAGNOSIS — Z01419 Encounter for gynecological examination (general) (routine) without abnormal findings: Secondary | ICD-10-CM | POA: Insufficient documentation

## 2013-10-29 DIAGNOSIS — M899 Disorder of bone, unspecified: Secondary | ICD-10-CM

## 2013-10-29 DIAGNOSIS — G4700 Insomnia, unspecified: Secondary | ICD-10-CM

## 2013-10-29 DIAGNOSIS — M48061 Spinal stenosis, lumbar region without neurogenic claudication: Secondary | ICD-10-CM

## 2013-10-29 DIAGNOSIS — N951 Menopausal and female climacteric states: Secondary | ICD-10-CM

## 2013-10-29 DIAGNOSIS — Z23 Encounter for immunization: Secondary | ICD-10-CM

## 2013-10-29 LAB — POCT URINALYSIS DIPSTICK
Blood, UA: NEGATIVE
Ketones, UA: NEGATIVE
Protein, UA: NEGATIVE
Spec Grav, UA: 1.015
Urobilinogen, UA: 0.2
pH, UA: 6

## 2013-10-29 NOTE — Progress Notes (Signed)
Subjective:    Patient ID: Natalie Parker, female    DOB: December 27, 1950, 62 y.o.   MRN: [id number]  HPI  62 year old White Female for health maintenance and evaluation of medical issues. History of lumbar back pain, osteopenia, spinal stenosis, hot flashes, insomnia. She takes hydrocodone/APAP sparingly for musculoskeletal pain. She takes Mobic for musculoskeletal pain and has taken Flexeril at bedtime previously. She tried Effexor for hot flashes but says it caused a headache. She was on estrogen replacement for a number of years but we took her off of it. However last year we restarted Prempro. She was depressed at that time and we started her on Celexa.  Social history: She is married. Has been a Interior and spatial designer for 40 years. Plans to work another year or 2 before retirement. Has smoked half pack daily for a number of years but quit smoking 2 years ago. No children.  Family history: Father died at age 11 of a brain tumor. 2 brothers in good health. Mother died at age 54 of unknown causes with history of psoriatic arthritis.  Past medical history: Tonsillectomy 1974, surgery for right knee meniscal tear April 2010. Gets annual mammogram. History of osteopenia. Had colonoscopy with Dr. Juanda Chance 2004. History of hyperplastic polyps.    Review of Systems  Constitutional: Positive for fatigue.  HENT: Negative.   Eyes: Negative.   Respiratory: Negative.   Cardiovascular: Negative.   Endocrine: Negative.   Genitourinary: Negative.   Musculoskeletal: Positive for arthralgias and back pain.  Neurological: Negative.   Hematological: Negative.   Psychiatric/Behavioral:       History of depression       Objective:   Physical Exam  Vitals reviewed. Constitutional: She is oriented to person, place, and time. She appears well-developed and well-nourished. No distress.  HENT:  Head: Normocephalic and atraumatic.  Right Ear: External ear normal.  Left Ear: External ear normal.  Mouth/Throat:  Oropharynx is clear and moist. No oropharyngeal exudate.  Eyes: Conjunctivae and EOM are normal. Pupils are equal, round, and reactive to light. Right eye exhibits no discharge. Left eye exhibits no discharge. No scleral icterus.  Neck: Neck supple. No JVD present. No thyromegaly present.  Cardiovascular: Normal rate, regular rhythm, normal heart sounds and intact distal pulses.   No murmur heard. Pulmonary/Chest: Effort normal and breath sounds normal. No respiratory distress. She has no wheezes. She has no rales.  Breasts normal female  Abdominal: Soft. Bowel sounds are normal. She exhibits no distension and no mass. There is no rebound and no guarding.  Genitourinary:  Pap taken-- bimanual normal  Musculoskeletal: Normal range of motion. She exhibits no edema.  Lymphadenopathy:    She has no cervical adenopathy.  Neurological: She is alert and oriented to person, place, and time. She has normal reflexes. No cranial nerve deficit. Coordination normal.  Skin: Skin is warm and dry. No rash noted. She is not diaphoretic.  Psychiatric: She has a normal mood and affect. Her behavior is normal. Judgment and thought content normal.          Assessment & Plan:  Musculoskeletal pain  History of low back pain-related to spinal stenosis. Has seen Dr. Ethelene Hal for epidural steroid injections  History of depression  Estrogen replacement-hot flashes improved  Insomnia-improved with Celexa and estrogen replacement  Past history of smoking-quit 2 years ago  History of osteopenia  Plan: Return in one year or as needed. Encouraged diet exercise and weight loss. Doesn't get much exercise do to musculoskeletal  pain and working long hours.

## 2013-10-29 NOTE — Patient Instructions (Addendum)
Return in one year or as needed. Continue same medications. Encouraged diet exercise and weight loss.

## 2013-11-05 ENCOUNTER — Ambulatory Visit
Admission: RE | Admit: 2013-11-05 | Discharge: 2013-11-05 | Disposition: A | Discharge status: Home / Self Care | Payer: BC Managed Care – PPO | Source: Ambulatory Visit

## 2013-11-05 ENCOUNTER — Other Ambulatory Visit: Payer: Self-pay | Admitting: Internal Medicine

## 2013-11-05 DIAGNOSIS — Z1231 Encounter for screening mammogram for malignant neoplasm of breast: Secondary | ICD-10-CM

## 2014-01-26 ENCOUNTER — Encounter: Payer: Self-pay | Admitting: Internal Medicine

## 2014-01-26 NOTE — Progress Notes (Signed)
   Subjective:    Patient ID: Natalie Parker, female    DOB: 1951/10/26, 63 y.o.   MRN: [id number]  HPI CPE rescheduled to Dec 2014    Review of Systems     Objective:   Physical Exam        Assessment & Plan:

## 2014-01-26 NOTE — Patient Instructions (Signed)
CPE rescheduled until Dec

## 2014-03-08 ENCOUNTER — Other Ambulatory Visit: Payer: Self-pay | Admitting: Internal Medicine

## 2014-07-24 ENCOUNTER — Emergency Department (HOSPITAL_BASED_OUTPATIENT_CLINIC_OR_DEPARTMENT_OTHER): Payer: BC Managed Care – PPO

## 2014-07-24 ENCOUNTER — Emergency Department (HOSPITAL_BASED_OUTPATIENT_CLINIC_OR_DEPARTMENT_OTHER)
Admission: EM | Admit: 2014-07-24 | Discharge: 2014-07-24 | Disposition: A | Discharge status: Home / Self Care | Payer: BC Managed Care – PPO | Attending: Emergency Medicine | Admitting: Emergency Medicine

## 2014-07-24 ENCOUNTER — Encounter (HOSPITAL_BASED_OUTPATIENT_CLINIC_OR_DEPARTMENT_OTHER): Payer: Self-pay | Admitting: Emergency Medicine

## 2014-07-24 DIAGNOSIS — M899 Disorder of bone, unspecified: Secondary | ICD-10-CM | POA: Diagnosis not present

## 2014-07-24 DIAGNOSIS — F329 Major depressive disorder, single episode, unspecified: Secondary | ICD-10-CM | POA: Insufficient documentation

## 2014-07-24 DIAGNOSIS — M949 Disorder of cartilage, unspecified: Secondary | ICD-10-CM

## 2014-07-24 DIAGNOSIS — IMO0002 Reserved for concepts with insufficient information to code with codable children: Secondary | ICD-10-CM | POA: Insufficient documentation

## 2014-07-24 DIAGNOSIS — Z87891 Personal history of nicotine dependence: Secondary | ICD-10-CM | POA: Insufficient documentation

## 2014-07-24 DIAGNOSIS — Z791 Long term (current) use of non-steroidal anti-inflammatories (NSAID): Secondary | ICD-10-CM | POA: Insufficient documentation

## 2014-07-24 DIAGNOSIS — F3289 Other specified depressive episodes: Secondary | ICD-10-CM | POA: Insufficient documentation

## 2014-07-24 DIAGNOSIS — M5416 Radiculopathy, lumbar region: Secondary | ICD-10-CM

## 2014-07-24 DIAGNOSIS — Z79899 Other long term (current) drug therapy: Secondary | ICD-10-CM | POA: Diagnosis not present

## 2014-07-24 DIAGNOSIS — M549 Dorsalgia, unspecified: Secondary | ICD-10-CM | POA: Diagnosis present

## 2014-07-24 LAB — URINALYSIS, ROUTINE W REFLEX MICROSCOPIC
Glucose, UA: NEGATIVE mg/dL
HGB URINE DIPSTICK: NEGATIVE
KETONES UR: NEGATIVE mg/dL
Leukocytes, UA: NEGATIVE
Nitrite: NEGATIVE
PROTEIN: NEGATIVE mg/dL
Specific Gravity, Urine: 1.041 — ABNORMAL HIGH (ref 1.005–1.030)
Urobilinogen, UA: 1 mg/dL (ref 0.0–1.0)
pH: 5.5 (ref 5.0–8.0)

## 2014-07-24 MED ORDER — OXYCODONE-ACETAMINOPHEN 5-325 MG PO TABS
2.0000 | ORAL_TABLET | Freq: Once | ORAL | Status: DC
  Filled 2014-07-24: qty 2

## 2014-07-24 MED ORDER — METHOCARBAMOL 500 MG PO TABS
1000.0000 mg | ORAL_TABLET | Freq: Once | ORAL | Status: AC
  Administered 2014-07-24: 1000 mg via ORAL
  Filled 2014-07-24: qty 2

## 2014-07-24 MED ORDER — GABAPENTIN 600 MG PO TABS: 600.0000 mg | ORAL_TABLET | Freq: Three times a day (TID) | ORAL | Status: DC

## 2014-07-24 MED ORDER — KETOROLAC TROMETHAMINE 60 MG/2ML IM SOLN
60.0000 mg | Freq: Once | INTRAMUSCULAR | Status: AC
  Administered 2014-07-24: 60 mg via INTRAMUSCULAR
  Filled 2014-07-24: qty 2

## 2014-07-24 MED ORDER — FENTANYL CITRATE 0.05 MG/ML IJ SOLN
100.0000 ug | Freq: Once | INTRAMUSCULAR | Status: AC
  Administered 2014-07-24: 100 ug via INTRAVENOUS
  Filled 2014-07-24: qty 2

## 2014-07-24 MED ORDER — DEXAMETHASONE SODIUM PHOSPHATE 10 MG/ML IJ SOLN: 10.0000 mg | Freq: Once | INTRAMUSCULAR | Status: AC

## 2014-07-24 MED ORDER — DEXAMETHASONE SODIUM PHOSPHATE 4 MG/ML IJ SOLN
INTRAMUSCULAR | Status: AC
  Administered 2014-07-24: 10 mg
  Filled 2014-07-24: qty 3

## 2014-07-24 MED ORDER — MELOXICAM 15 MG PO TABS: 15.0000 mg | ORAL_TABLET | Freq: Every day | ORAL | Status: DC

## 2014-07-24 MED ORDER — GABAPENTIN 300 MG PO CAPS
400.0000 mg | ORAL_CAPSULE | Freq: Three times a day (TID) | ORAL | Status: DC
  Administered 2014-07-24: 400 mg via ORAL
  Filled 2014-07-24 (×2): qty 1

## 2014-07-24 MED ORDER — LIDOCAINE 5 % EX PTCH: 1.0000 | MEDICATED_PATCH | CUTANEOUS | Status: DC

## 2014-07-24 MED ORDER — METAXALONE 800 MG PO TABS
800.0000 mg | ORAL_TABLET | Freq: Three times a day (TID) | ORAL | Status: DC
Start: 2014-07-24 — End: 2014-11-18

## 2014-07-24 NOTE — ED Provider Notes (Signed)
CSN: [id number]     Arrival date & time 07/24/14  0458 History   None    Chief Complaint  Patient presents with  . Back Pain     (Consider location/radiation/quality/duration/timing/severity/associated sxs/prior Treatment) Patient is a 63 y.o. female presenting with back pain.  Back Pain Location:  Sacro-iliac joint Quality:  Stabbing Radiates to:  R posterior upper leg Pain severity:  Severe Pain is:  Same all the time Onset quality:  Gradual Timing:  Constant Progression:  Worsening (for 3 days) Chronicity:  Chronic Context: not falling, not MCA and not MVA   Relieved by:  Nothing Worsened by:  Standing Ineffective treatments:  Muscle relaxants and cold packs Associated symptoms: no abdominal pain, no abdominal swelling, no bladder incontinence, no bowel incontinence, no chest pain, no fever, no headaches, no numbness, no paresthesias, no pelvic pain, no perianal numbness, no tingling, no weakness and no weight loss   Risk factors: no hx of cancer and no recent surgery   See by Dr. Nelva Bush, initially epidural injections and meds were helping but not for the past 3 days.  She is taking norco, scheduled flexeril and voltaren and has started steroids x 2 days.    Past Medical History  Diagnosis Date  . Depression   . Osteopenia   . Spinal stenosis    Past Surgical History  Procedure Laterality Date  . Tonsillectomy    . Repair torn meniscus     No family history on file. History  Substance Use Topics  . Smoking status: Former Smoker -- 0.50 packs/day for 20 years    Types: Cigarettes    Quit date: 02/29/2012  . Smokeless tobacco: Never Used  . Alcohol Use: Yes     Comment: occasional   OB History   Grav Para Term Preterm Abortions TAB SAB Ect Mult Living                 Review of Systems  Constitutional: Negative for fever and weight loss.  Respiratory: Negative for shortness of breath.   Cardiovascular: Negative for chest pain.  Gastrointestinal: Negative for  vomiting, abdominal pain, diarrhea and bowel incontinence.  Genitourinary: Negative for bladder incontinence, difficulty urinating and pelvic pain.  Musculoskeletal: Positive for back pain. Negative for arthralgias, joint swelling, myalgias, neck pain and neck stiffness.  Neurological: Negative for tingling, weakness, numbness, headaches and paresthesias.  All other systems reviewed and are negative.     Allergies  E-mycin  Home Medications   Prior to Admission medications   Medication Sig Start Date End Date Taking? Authorizing Provider  calcium carbonate (TUMS EX) 750 MG chewable tablet Chew 1 tablet by mouth daily.      Historical Provider, MD  cyclobenzaprine (FLEXERIL) 10 MG tablet Take 10 mg by mouth at bedtime.     Historical Provider, MD  gabapentin (NEURONTIN) 100 MG capsule Take 100 mg by mouth 3 (three) times daily. 2 capsule tid    Historical Provider, MD  gabapentin (NEURONTIN) 600 MG tablet Take 1 tablet (600 mg total) by mouth 3 (three) times daily. 07/24/14   Myldred Raju K Aradhana Gin-Rasch, MD  lidocaine (LIDODERM) 5 % Place 1 patch onto the skin daily. Remove & Discard patch within 12 hours or as directed by MD 07/24/14   Javonn Gauger K Toneshia Coello-Rasch, MD  meloxicam (MOBIC) 15 MG tablet TAKE 1 TABLET EVERY DAY 11/05/13   Elby Showers, MD  meloxicam (MOBIC) 15 MG tablet Take 1 tablet (15 mg total) by mouth daily. 07/24/14  Desiree Fleming K Ambri Miltner-Rasch, MD  metaxalone (SKELAXIN) 800 MG tablet Take 1 tablet (800 mg total) by mouth 3 (three) times daily. 07/24/14   Tameem Pullara K Senita Corredor-Rasch, MD  Multiple Vitamin (MULTIVITAMIN) tablet Take 1 tablet by mouth daily.      Historical Provider, MD  PREMPRO 0.625-5 MG per tablet TAKE 1 TABLET EVERY DAY 03/08/14   Elby Showers, MD   BP 180/91  Pulse 85  Temp(Src) 99.2 F (37.3 C) (Oral)  Resp 18  SpO2 99% Physical Exam  Constitutional: She is oriented to person, place, and time. She appears well-developed and well-nourished.  HENT:  Head: Normocephalic and  atraumatic.  Mouth/Throat: Oropharynx is clear and moist.  Eyes: Conjunctivae and EOM are normal.  Pinpoint pupils  Neck: Normal range of motion. Neck supple.  Cardiovascular: Normal rate, regular rhythm and intact distal pulses.  Exam reveals no gallop and no friction rub.   No murmur heard. Pulmonary/Chest: Effort normal and breath sounds normal. She has no wheezes. She has no rales.  Abdominal: Soft. Bowel sounds are normal. There is no tenderness. There is no rebound and no guarding.  Musculoskeletal: Normal range of motion. She exhibits no edema and no tenderness.  L5/s1 intact.  No step offs nor crepitance nor point tenderness of the C t or L spine  Neurological: She is alert and oriented to person, place, and time. She has normal reflexes. She displays no atrophy. No sensory deficit. She exhibits normal muscle tone. Coordination normal. GCS eye subscore is 4. GCS verbal subscore is 5. GCS motor subscore is 6.  Reflex Scores:      Tricep reflexes are 2+ on the right side and 2+ on the left side.      Bicep reflexes are 2+ on the right side and 2+ on the left side.      Brachioradialis reflexes are 2+ on the right side and 2+ on the left side.      Patellar reflexes are 2+ on the right side and 2+ on the left side.      Achilles reflexes are 2+ on the right side and 2+ on the left side. Skin: Skin is warm and dry. She is not diaphoretic.  Psychiatric:  tearful    ED Course  Procedures (including critical care time) Labs Review Labs Reviewed  URINALYSIS, ROUTINE W REFLEX MICROSCOPIC    Imaging Review No results found.   EKG Interpretation None     Medications  gabapentin (NEURONTIN) capsule 400 mg (400 mg Oral Given 07/24/14 0629)  ketorolac (TORADOL) injection 60 mg (60 mg Intramuscular Given 07/24/14 0520)  dexamethasone (DECADRON) injection 10 mg (0 mg Intramuscular Duplicate 04/02/68 7948)  methocarbamol (ROBAXIN) tablet 1,000 mg (1,000 mg Oral Given 07/24/14 0520)   dexamethasone (DECADRON) 4 MG/ML injection (10 mg  Given 07/24/14 0520)  fentaNYL (SUBLIMAZE) injection 100 mcg (100 mcg Intravenous Given 07/24/14 0528)    MDM   Final diagnoses:  Lumbar radiculopathy  Patient reports she is scheduled for MRI this coming week.  While it is painful to walk on RLE she has not weakness nor numbness.  No bowel nor bladder symptoms, No fever neck pain no CP no stigmata of infection.    Patient is seen by Dr. Nelva Bush of PMR and pain management.  She is currently on scheduled narcotic pain medication, prednisone, low dose neurontin and voltaren.  These medications and her spinal injections are not helping.  Will change flexeril to skelaxin as it is a superior muscle relaxant, will  add lidoderm patches, will change voltaren to meloxicam as patient has done well with this medication in the past.  I have increased her neurontin from 20 mg TID to 600 mg TID.  She is not at optimal dosage at this time.  Due to pain management cannot supercede narcotic orders.  Will instruct close follow up with Dr. Nelva Bush for medication titration as appropriate.  Also recommend miralax for constipation so patient is not straining to have BM which may exacerbate pain.      Carlisle Beers, MD 07/24/14 [phone]

## 2014-07-24 NOTE — Discharge Instructions (Signed)

## 2014-07-24 NOTE — ED Notes (Signed)
Pt presents with back pain that she has had for 3 days but much worse today that she can hardley bare weight on her rt leg.

## 2014-07-24 NOTE — ED Notes (Signed)
PT unable to lay flat for xray. Dr Randal Buba made aware.Marland KitchenMarland Kitchen

## 2014-07-30 ENCOUNTER — Ambulatory Visit (INDEPENDENT_AMBULATORY_CARE_PROVIDER_SITE_OTHER): Payer: BC Managed Care – PPO | Admitting: Internal Medicine

## 2014-07-30 VITALS — BP 128/80 | HR 78 | Ht 67.5 in | Wt 207.0 lb

## 2014-07-30 DIAGNOSIS — M545 Low back pain, unspecified: Secondary | ICD-10-CM

## 2014-07-30 MED ORDER — METHYLPREDNISOLONE 4 MG PO TABS: ORAL_TABLET | ORAL | Status: DC

## 2014-07-30 MED ORDER — MEPERIDINE HCL 50 MG/ML IJ SOLN
50.0000 mg | Freq: Once | INTRAMUSCULAR | Status: AC
  Administered 2014-07-30: 50 mg via INTRAMUSCULAR

## 2014-07-30 MED ORDER — HYDROMORPHONE HCL 2 MG PO TABS
2.0000 mg | ORAL_TABLET | Freq: Two times a day (BID) | ORAL | Status: DC | PRN
Start: 2014-07-30 — End: 2014-11-18

## 2014-07-30 MED ORDER — PROMETHAZINE HCL 25 MG/ML IJ SOLN
25.0000 mg | Freq: Once | INTRAMUSCULAR | Status: AC
  Administered 2014-07-30: 25 mg via INTRAMUSCULAR

## 2014-07-30 NOTE — Patient Instructions (Signed)
Stop Hydrocodone APAP. Take Dilaudid 2 milligrams twice daily. Take Medrol Dosepak tapering course over 12 days. Appointment to be made with neurosurge Stop Mobic and Skelaxin. May continue gabapentin

## 2014-08-08 ENCOUNTER — Encounter: Payer: Self-pay | Admitting: Internal Medicine

## 2014-08-08 NOTE — Progress Notes (Signed)
   Subjective:    Patient ID: Natalie Parker, female    DOB: 1951-04-17, 63 y.o.   MRN: [id number]  HPI  Patient was seen in the Emergency Department August 26 with severe back pain. She has been seeing Dr. Nelva Bush for back pain and has had epidural steroid injections. Says there has not been any relief and she's been incapacitated for the last 10 days or so. No loss of bowel or bladder control. She had an MRI done by Dr. Nelva Bush of the LS spine 07/29/2014 grams per orthopedics. Findings included normal L1 L2. At L2-L3 there was a faint annular disc tear with trace retrolisthesis of L2 on L3. Mild disc bulge osteophyte complex flattening the thecal sac and extending into the neural foramina. At L3-L4 there was minimal retrolisthesis of L3-L4 and disc bulge osteophyte complex flattening the ventral thecal sac extending into the neural foramina. Severe right lateral recess and mild left lateral recess stenosis.  L4-L5 showed trace retrolisthesis of L4 on L5. Mild foraminal stenosis. Disc bulge osteophyte complex extending into neural foramina and superimposed right disc extrusion. L5-S1 disc bulge osteophyte complex extending into the neural foramina. Moderate left and mild to moderate right foraminal stenosis. S1-S2 showing patent central canal and neural foramina.  Says leg is weak and feels a bit numb. Numbness distribution is in L3 dermatome.   Norco 5/325 and muscle relaxant have not helped pain. Was started on steroid dosepak 8/24. Emergency department physician started her on Mobic and gabapentin. She also has a lidocaine patch.    Review of Systems     Objective:   Physical Exam Patient appears to be in a great deal of pain. She is moving slowly. Deep tendon reflexes 1+ and symmetrical in the knees. Decreased muscle strength in right lower extremity 4/5 instead of 5 over 5.        Assessment & Plan:  Lumbar radiculopathy which has not responded to multiple modalities  Plan: Sterapred DS 10  mg 12 day dosepak. Dilaudid 2 milligrams twice daily. Appointment with neurosurgeon. Demerol 50 mg with 25 mg Phenergan IM given in office today for acute pain relief.  25 minutes spent with patient

## 2014-08-19 ENCOUNTER — Other Ambulatory Visit: Payer: Self-pay | Admitting: Internal Medicine

## 2014-10-03 ENCOUNTER — Other Ambulatory Visit: Payer: Self-pay

## 2014-10-03 DIAGNOSIS — Z1231 Encounter for screening mammogram for malignant neoplasm of breast: Secondary | ICD-10-CM

## 2014-11-11 ENCOUNTER — Ambulatory Visit
Admission: RE | Admit: 2014-11-11 | Discharge: 2014-11-11 | Disposition: A | Discharge status: Home / Self Care | Payer: BC Managed Care – PPO | Source: Ambulatory Visit

## 2014-11-11 DIAGNOSIS — Z1231 Encounter for screening mammogram for malignant neoplasm of breast: Secondary | ICD-10-CM

## 2014-11-18 ENCOUNTER — Ambulatory Visit (INDEPENDENT_AMBULATORY_CARE_PROVIDER_SITE_OTHER): Payer: BC Managed Care – PPO | Admitting: Internal Medicine

## 2014-11-18 ENCOUNTER — Other Ambulatory Visit: Payer: BC Managed Care – PPO | Admitting: Internal Medicine

## 2014-11-18 ENCOUNTER — Encounter: Payer: Self-pay | Admitting: Internal Medicine

## 2014-11-18 VITALS — BP 116/82 | HR 88 | Temp 97.7°F | Wt 225.0 lb

## 2014-11-18 DIAGNOSIS — M545 Low back pain, unspecified: Secondary | ICD-10-CM

## 2014-11-18 DIAGNOSIS — Z0000 Encounter for general adult medical examination without abnormal findings: Secondary | ICD-10-CM

## 2014-11-18 DIAGNOSIS — G609 Hereditary and idiopathic neuropathy, unspecified: Secondary | ICD-10-CM

## 2014-11-18 DIAGNOSIS — N951 Menopausal and female climacteric states: Secondary | ICD-10-CM

## 2014-11-18 DIAGNOSIS — R232 Flushing: Secondary | ICD-10-CM

## 2014-11-18 DIAGNOSIS — G4700 Insomnia, unspecified: Secondary | ICD-10-CM

## 2014-11-18 DIAGNOSIS — Z1321 Encounter for screening for nutritional disorder: Secondary | ICD-10-CM

## 2014-11-18 DIAGNOSIS — Z1329 Encounter for screening for other suspected endocrine disorder: Secondary | ICD-10-CM

## 2014-11-18 DIAGNOSIS — Z130 Encounter for screening for diseases of the blood and blood-forming organs and certain disorders involving the immune mechanism: Secondary | ICD-10-CM

## 2014-11-18 DIAGNOSIS — Z13220 Encounter for screening for lipoid disorders: Secondary | ICD-10-CM

## 2014-11-18 LAB — CBC WITH DIFFERENTIAL/PLATELET
BASOS ABS: 0 10*3/uL (ref 0.0–0.1)
BASOS PCT: 0 % (ref 0–1)
EOS ABS: 0.1 10*3/uL (ref 0.0–0.7)
Eosinophils Relative: 2 % (ref 0–5)
HCT: 39 % (ref 36.0–46.0)
Hemoglobin: 13 g/dL (ref 12.0–15.0)
LYMPHS PCT: 40 % (ref 12–46)
Lymphs Abs: 2 10*3/uL (ref 0.7–4.0)
MCH: 32.2 pg (ref 26.0–34.0)
MCHC: 33.3 g/dL (ref 30.0–36.0)
MCV: 96.5 fL (ref 78.0–100.0)
MONO ABS: 0.4 10*3/uL (ref 0.1–1.0)
MPV: 8.4 fL — ABNORMAL LOW (ref 9.4–12.4)
Monocytes Relative: 9 % (ref 3–12)
Neutro Abs: 2.4 10*3/uL (ref 1.7–7.7)
Neutrophils Relative %: 49 % (ref 43–77)
Platelets: 259 10*3/uL (ref 150–400)
RBC: 4.04 MIL/uL (ref 3.87–5.11)
RDW: 12.9 % (ref 11.5–15.5)
WBC: 4.9 10*3/uL (ref 4.0–10.5)

## 2014-11-18 LAB — POCT URINALYSIS DIPSTICK
BILIRUBIN UA: NEGATIVE
Blood, UA: NEGATIVE
Glucose, UA: NEGATIVE
Ketones, UA: NEGATIVE
Leukocytes, UA: NEGATIVE
NITRITE UA: NEGATIVE
PROTEIN UA: NEGATIVE
Urobilinogen, UA: NEGATIVE
pH, UA: 5

## 2014-11-18 LAB — COMPREHENSIVE METABOLIC PANEL
ALBUMIN: 4.1 g/dL (ref 3.5–5.2)
ALT: 14 U/L (ref 0–35)
AST: 18 U/L (ref 0–37)
Alkaline Phosphatase: 36 U/L — ABNORMAL LOW (ref 39–117)
BUN: 16 mg/dL (ref 6–23)
CO2: 26 mEq/L (ref 19–32)
Calcium: 9.4 mg/dL (ref 8.4–10.5)
Chloride: 105 mEq/L (ref 96–112)
Creat: 0.63 mg/dL (ref 0.50–1.10)
Glucose, Bld: 88 mg/dL (ref 70–99)
POTASSIUM: 4.4 meq/L (ref 3.5–5.3)
Sodium: 140 mEq/L (ref 135–145)
Total Bilirubin: 0.4 mg/dL (ref 0.2–1.2)
Total Protein: 6.5 g/dL (ref 6.0–8.3)

## 2014-11-18 LAB — LIPID PANEL
Cholesterol: 213 mg/dL — ABNORMAL HIGH (ref 0–200)
HDL: 63 mg/dL (ref >39)
LDL CALC: 127 mg/dL — AB (ref 0–99)
Total CHOL/HDL Ratio: 3.4 Ratio
Triglycerides: 117 mg/dL (ref <150)
VLDL: 23 mg/dL (ref 0–40)

## 2014-11-18 MED ORDER — CONJ ESTROG-MEDROXYPROGEST ACE 0.625-5 MG PO TABS: 1.0000 | ORAL_TABLET | Freq: Every day | ORAL | Status: DC

## 2014-11-18 MED ORDER — DICLOFENAC SODIUM 75 MG PO TBEC: 75.0000 mg | DELAYED_RELEASE_TABLET | Freq: Two times a day (BID) | ORAL | Status: DC

## 2014-11-18 MED ORDER — HYDROCODONE-ACETAMINOPHEN 5-325 MG PO TABS: 1.0000 | ORAL_TABLET | Freq: Two times a day (BID) | ORAL | Status: DC

## 2014-11-18 MED ORDER — CYCLOBENZAPRINE HCL 10 MG PO TABS: 10.0000 mg | ORAL_TABLET | Freq: Every day | ORAL | Status: DC

## 2014-11-18 MED ORDER — GABAPENTIN 100 MG PO CAPS: ORAL_CAPSULE | ORAL | Status: DC

## 2014-11-18 NOTE — Patient Instructions (Addendum)
Continue same meds and return in one year. Try gabapentin for pain and numbness.

## 2014-11-19 LAB — TSH: TSH: 1.882 u[IU]/mL (ref 0.350–4.500)

## 2014-11-19 LAB — VITAMIN D 25 HYDROXY (VIT D DEFICIENCY, FRACTURES): Vit D, 25-Hydroxy: 28 ng/mL — ABNORMAL LOW (ref 30–100)

## 2014-12-27 ENCOUNTER — Encounter: Payer: Self-pay | Admitting: Internal Medicine

## 2014-12-27 NOTE — Progress Notes (Signed)
   Subjective:    Patient ID: Natalie Parker, female    DOB: [birth year], 64 y.o.   MRN: [id number]  HPI  64 year old white female in today for health maintenance exam and evaluation of medical issues. She has a history of lumbar back pain status post recent lumbar surgery per Dr. Vertell Limber. History of osteopenia, spinal stenosis, hot flashes, insomnia. She takes hydrocodone/APAP for musculoskeletal pain. She has taken Flexeril at bedtime as well. She tried Effexor for hot flashes but caused a headache. She was on estrogen replacement for a number of years but we took her off of it. 2 years ago, menopausal symptoms were so severe that we restarted Prempro. She was depressed and was started on Celexa.  Social history: She is married. 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 Has smoked a half pack daily for a number of years but quit smoking 3 years ago. No children.  Family history: Father died at age 58 of a brain tumor. 2 brothers in good health. Mother died at age 41 of unknown causes with history of psoriatic arthritis.  Past medical history: Tonsillectomy 1974, surgery for right knee meniscal tear April 2010. Gets annual mammogram. History of osteopenia. Had colonoscopy with Dr. Olevia Perches April 2015. History of hyperplastic polyps.    Review of Systems  Constitutional: Positive for fatigue.  Endocrine:       Hot flashes  Musculoskeletal:       Back pain and arthralgias  Psychiatric/Behavioral:       History of depression. History of insomnia.       Objective:   Physical Exam  Constitutional: She is oriented to person, place, and time. She appears well-developed and well-nourished. No distress.  HENT:  Head: Normocephalic and atraumatic.  Right Ear: External ear normal.  Left Ear: External ear normal.  Mouth/Throat: Oropharynx is clear and moist. No oropharyngeal exudate.  Eyes: Conjunctivae are normal. Right eye exhibits no discharge. No scleral icterus.  Neck: Neck supple. No JVD  present. No thyromegaly present.  Cardiovascular: Normal rate, regular rhythm, normal heart sounds and intact distal pulses.   No murmur heard. Pulmonary/Chest: Effort normal and breath sounds normal. She has no wheezes.  Abdominal: Soft. Bowel sounds are normal. She exhibits no distension and no mass. There is no tenderness. There is no rebound and no guarding.  Genitourinary:  Pap taken and 2014. Bimanual exam normal.  Musculoskeletal: Normal range of motion. She exhibits no edema.  Lymphadenopathy:    She has no cervical adenopathy.  Neurological: She is alert and oriented to person, place, and time. She has normal reflexes. No cranial nerve deficit.  Skin: No rash noted. She is not diaphoretic.  Psychiatric: She has a normal mood and affect. Her behavior is normal. Judgment and thought content normal.  Vitals reviewed.         Assessment & Plan:  History of menopausal hot flashes-was to come off Prempro  History of depression-currently not on SSRI  History of lumbar back pain status post surgery per Dr. Vertell Limber improved but still has some back pain  History of arthralgias-treated sparingly with hydrocodone/APAP and Flexeril  Past history of smoking  History of osteopenia-continue vitamin D supplement  Neuropathy-trial of gabapentin  Plan: Continue to treat sparingly with hydrocodone/APAP and Flexeril. Return in one year or as needed. Encouraged diet exercise and weight loss.

## 2015-01-09 ENCOUNTER — Telehealth: Payer: Self-pay | Admitting: Internal Medicine

## 2015-01-09 MED ORDER — HYDROCODONE-ACETAMINOPHEN 5-325 MG PO TABS: 1.0000 | ORAL_TABLET | Freq: Two times a day (BID) | ORAL | Status: DC

## 2015-01-09 NOTE — Telephone Encounter (Signed)
Refill hydrocodone/APAP 5/325 #60 at patient request

## 2015-02-10 ENCOUNTER — Telehealth: Payer: Self-pay | Admitting: *Deleted

## 2015-02-10 ENCOUNTER — Other Ambulatory Visit: Payer: Self-pay | Admitting: *Deleted

## 2015-02-10 MED ORDER — HYDROCODONE-ACETAMINOPHEN 5-325 MG PO TABS
1.0000 | ORAL_TABLET | Freq: Two times a day (BID) | ORAL | Status: DC
Start: 2015-02-10 — End: 2015-03-17

## 2015-02-10 NOTE — Telephone Encounter (Signed)
Hydrocodone script printed ready for signature

## 2015-02-10 NOTE — Telephone Encounter (Signed)
Refill once 

## 2015-02-10 NOTE — Telephone Encounter (Signed)
Patient requesting refill on Hydrocodone last filled 01/09/15 . OK to refill?

## 2015-03-14 ENCOUNTER — Telehealth: Payer: Self-pay | Admitting: Internal Medicine

## 2015-03-14 NOTE — Telephone Encounter (Signed)
Needs refill on Norco/Vicodin 5-325mg .  She takes it 1 bid.  Looks like it was last filled on 02/10/15.  Advised patient it would be at least lunch time before we could get to this.  Told her we would call her when Rx was ready to be picked up.    Please call when ready.   Thanks.

## 2015-03-16 NOTE — Telephone Encounter (Signed)
Was this handled on Friday with written Rx?

## 2015-03-17 ENCOUNTER — Other Ambulatory Visit: Payer: Self-pay | Admitting: *Deleted

## 2015-03-17 MED ORDER — HYDROCODONE-ACETAMINOPHEN 5-325 MG PO TABS: 1.0000 | ORAL_TABLET | Freq: Two times a day (BID) | ORAL | Status: DC

## 2015-03-17 NOTE — Telephone Encounter (Signed)
Spoke with patient to advise Rx is ready for pick up.

## 2015-03-17 NOTE — Telephone Encounter (Signed)
Printed script for Dr Renold Genta to sign

## 2015-04-24 ENCOUNTER — Other Ambulatory Visit: Payer: Self-pay | Admitting: *Deleted

## 2015-04-24 ENCOUNTER — Telehealth: Payer: Self-pay | Admitting: Internal Medicine

## 2015-04-24 MED ORDER — HYDROCODONE-ACETAMINOPHEN 5-325 MG PO TABS
1.0000 | ORAL_TABLET | Freq: Two times a day (BID) | ORAL | Status: DC
Start: 2015-04-24 — End: 2018-02-26

## 2015-04-24 NOTE — Telephone Encounter (Signed)
Would like a refill on her Norco Rx 5-325 mg.  She takes it bid.  Last filled 03/17/15.    Please call patient when ready for pick up.

## 2015-04-24 NOTE — Telephone Encounter (Signed)
Patient to be referred to pain management. Script for hydrocodone printed

## 2015-04-24 NOTE — Telephone Encounter (Signed)
Will give 30 days. Refer to pain management

## 2015-05-22 ENCOUNTER — Encounter: Payer: Self-pay | Admitting: Internal Medicine

## 2015-05-22 ENCOUNTER — Ambulatory Visit (INDEPENDENT_AMBULATORY_CARE_PROVIDER_SITE_OTHER): Payer: BLUE CROSS/BLUE SHIELD | Admitting: Internal Medicine

## 2015-05-22 VITALS — BP 126/84 | HR 87 | Temp 98.2°F | Wt 230.0 lb

## 2015-05-22 DIAGNOSIS — R5381 Other malaise: Secondary | ICD-10-CM | POA: Diagnosis not present

## 2015-05-22 DIAGNOSIS — R000 Tachycardia, unspecified: Secondary | ICD-10-CM | POA: Diagnosis not present

## 2015-05-22 LAB — CBC WITH DIFFERENTIAL/PLATELET
BASOS PCT: 0 % (ref 0–1)
Basophils Absolute: 0 10*3/uL (ref 0.0–0.1)
EOS ABS: 0.1 10*3/uL (ref 0.0–0.7)
Eosinophils Relative: 2 % (ref 0–5)
HEMATOCRIT: 37.4 % (ref 36.0–46.0)
HEMOGLOBIN: 12.4 g/dL (ref 12.0–15.0)
LYMPHS ABS: 2.3 10*3/uL (ref 0.7–4.0)
Lymphocytes Relative: 38 % (ref 12–46)
MCH: 31.5 pg (ref 26.0–34.0)
MCHC: 33.2 g/dL (ref 30.0–36.0)
MCV: 94.9 fL (ref 78.0–100.0)
MONO ABS: 0.5 10*3/uL (ref 0.1–1.0)
MPV: 8.7 fL (ref 8.6–12.4)
Monocytes Relative: 9 % (ref 3–12)
Neutro Abs: 3.1 10*3/uL (ref 1.7–7.7)
Neutrophils Relative %: 51 % (ref 43–77)
Platelets: 285 10*3/uL (ref 150–400)
RBC: 3.94 MIL/uL (ref 3.87–5.11)
RDW: 13.1 % (ref 11.5–15.5)
WBC: 6.1 10*3/uL (ref 4.0–10.5)

## 2015-05-22 LAB — COMPREHENSIVE METABOLIC PANEL
ALBUMIN: 4.2 g/dL (ref 3.5–5.2)
ALT: 17 U/L (ref 0–35)
AST: 20 U/L (ref 0–37)
Alkaline Phosphatase: 36 U/L — ABNORMAL LOW (ref 39–117)
BUN: 24 mg/dL — ABNORMAL HIGH (ref 6–23)
CO2: 25 mEq/L (ref 19–32)
Calcium: 9.3 mg/dL (ref 8.4–10.5)
Chloride: 104 mEq/L (ref 96–112)
Creat: 0.7 mg/dL (ref 0.50–1.10)
GLUCOSE: 88 mg/dL (ref 70–99)
POTASSIUM: 3.9 meq/L (ref 3.5–5.3)
Sodium: 140 mEq/L (ref 135–145)
TOTAL PROTEIN: 6.8 g/dL (ref 6.0–8.3)
Total Bilirubin: 0.3 mg/dL (ref 0.2–1.2)

## 2015-05-23 LAB — TSH: TSH: 1.23 u[IU]/mL (ref 0.350–4.500)

## 2015-05-23 LAB — T4, FREE: FREE T4: 1.01 ng/dL (ref 0.80–1.80)

## 2015-05-28 ENCOUNTER — Encounter: Payer: Self-pay | Admitting: Internal Medicine

## 2015-05-28 NOTE — Progress Notes (Signed)
   Subjective:    Patient ID: Natalie Parker, female    DOB: [birth year], 64 y.o.   MRN: [id number]  HPI  Patient has retired and has begun exercising at gymnasium. Has trainer helping her with some exercises. Apparently yesterday had significant elevation of her blood pressure just after exercising which was concerning to her. She had no significant chest pain or shortness of breath. Felt rapid heart rate. Symptoms resolved within a fairly short period of time but she's not sure exactly how long it took. Not sure if palpitations were regular or not.    Review of Systems     Objective:   Physical Exam  Skin warm and dry. Nodes none. Neck is supple without JVD thyromegaly or carotid bruits. Chest clear to auscultation. Cardiac exam regular rate and rhythm normal S1 and S2. Extremities without edema. EKG shows new right bundle branch block since 2006 otherwise no acute changes      Assessment & Plan:  Explained to patient that blood pressure typically was elevated with exercise and just after exercise but there would be a recovery period of some 30 minutes. Not sure exactly what caused her to be so symptomatic. She was reassured. If issues persist, we will refer her to cardiologist. Suspect symptoms are due to deconditioning.  CBC with differential, complete metabolic panel, free T4 and TSH checked today and all are within normal limits

## 2015-05-28 NOTE — Patient Instructions (Signed)
Call if symptoms persist. Lab work is within normal limits. Continue your work out at gym as you have been doing previously

## 2015-10-22 ENCOUNTER — Other Ambulatory Visit: Payer: Self-pay

## 2015-10-22 DIAGNOSIS — Z1231 Encounter for screening mammogram for malignant neoplasm of breast: Secondary | ICD-10-CM

## 2015-11-20 ENCOUNTER — Other Ambulatory Visit: Payer: Self-pay | Admitting: Internal Medicine

## 2015-12-02 ENCOUNTER — Ambulatory Visit
Admission: RE | Admit: 2015-12-02 | Discharge: 2015-12-02 | Disposition: A | Discharge status: Home / Self Care | Payer: Managed Care, Other (non HMO) | Source: Ambulatory Visit

## 2015-12-02 DIAGNOSIS — Z1231 Encounter for screening mammogram for malignant neoplasm of breast: Secondary | ICD-10-CM

## 2015-12-09 ENCOUNTER — Other Ambulatory Visit: Payer: Managed Care, Other (non HMO) | Admitting: Internal Medicine

## 2015-12-09 DIAGNOSIS — Z79899 Other long term (current) drug therapy: Secondary | ICD-10-CM

## 2015-12-09 DIAGNOSIS — Z1321 Encounter for screening for nutritional disorder: Secondary | ICD-10-CM

## 2015-12-09 DIAGNOSIS — Z0000 Encounter for general adult medical examination without abnormal findings: Secondary | ICD-10-CM

## 2015-12-09 DIAGNOSIS — Z13220 Encounter for screening for lipoid disorders: Secondary | ICD-10-CM

## 2015-12-09 DIAGNOSIS — Z130 Encounter for screening for diseases of the blood and blood-forming organs and certain disorders involving the immune mechanism: Secondary | ICD-10-CM

## 2015-12-09 DIAGNOSIS — Z1329 Encounter for screening for other suspected endocrine disorder: Secondary | ICD-10-CM

## 2015-12-09 LAB — CBC WITH DIFFERENTIAL/PLATELET
BASOS ABS: 0 10*3/uL (ref 0.0–0.1)
Basophils Relative: 0 % (ref 0–1)
EOS PCT: 2 % (ref 0–5)
Eosinophils Absolute: 0.1 10*3/uL (ref 0.0–0.7)
HCT: 39.2 % (ref 36.0–46.0)
Hemoglobin: 13.5 g/dL (ref 12.0–15.0)
LYMPHS ABS: 2.4 10*3/uL (ref 0.7–4.0)
Lymphocytes Relative: 45 % (ref 12–46)
MCH: 31.5 pg (ref 26.0–34.0)
MCHC: 34.4 g/dL (ref 30.0–36.0)
MCV: 91.4 fL (ref 78.0–100.0)
MONO ABS: 0.6 10*3/uL (ref 0.1–1.0)
MPV: 8.7 fL (ref 8.6–12.4)
Monocytes Relative: 12 % (ref 3–12)
Neutro Abs: 2.2 10*3/uL (ref 1.7–7.7)
Neutrophils Relative %: 41 % — ABNORMAL LOW (ref 43–77)
PLATELETS: 262 10*3/uL (ref 150–400)
RBC: 4.29 MIL/uL (ref 3.87–5.11)
RDW: 13.6 % (ref 11.5–15.5)
WBC: 5.4 10*3/uL (ref 4.0–10.5)

## 2015-12-09 LAB — COMPLETE METABOLIC PANEL WITH GFR
ALT: 13 U/L (ref 6–29)
AST: 16 U/L (ref 10–35)
Albumin: 4.4 g/dL (ref 3.6–5.1)
Alkaline Phosphatase: 28 U/L — ABNORMAL LOW (ref 33–130)
BUN: 15 mg/dL (ref 7–25)
CALCIUM: 9.2 mg/dL (ref 8.6–10.4)
CO2: 25 mmol/L (ref 20–31)
Chloride: 104 mmol/L (ref 98–110)
Creat: 0.63 mg/dL (ref 0.50–0.99)
GFR, Est African American: >89 mL/min (ref >=60)
Glucose, Bld: 90 mg/dL (ref 65–99)
POTASSIUM: 4.1 mmol/L (ref 3.5–5.3)
Sodium: 139 mmol/L (ref 135–146)
Total Bilirubin: 0.4 mg/dL (ref 0.2–1.2)
Total Protein: 6.8 g/dL (ref 6.1–8.1)

## 2015-12-09 LAB — LIPID PANEL
CHOLESTEROL: 186 mg/dL (ref 125–200)
HDL: 57 mg/dL (ref >=46)
LDL CALC: 111 mg/dL (ref <130)
Total CHOL/HDL Ratio: 3.3 Ratio (ref <=5.0)
Triglycerides: 90 mg/dL (ref <150)
VLDL: 18 mg/dL (ref <30)

## 2015-12-09 LAB — TSH: TSH: 2.215 u[IU]/mL (ref 0.350–4.500)

## 2015-12-10 LAB — VITAMIN D 25 HYDROXY (VIT D DEFICIENCY, FRACTURES): VIT D 25 HYDROXY: 41 ng/mL (ref 30–100)

## 2015-12-12 ENCOUNTER — Encounter: Payer: Self-pay | Admitting: Internal Medicine

## 2015-12-12 ENCOUNTER — Ambulatory Visit (INDEPENDENT_AMBULATORY_CARE_PROVIDER_SITE_OTHER): Payer: Managed Care, Other (non HMO) | Admitting: Internal Medicine

## 2015-12-12 VITALS — BP 112/78 | HR 75 | Temp 98.9°F | Resp 20 | Ht 68.0 in | Wt 225.0 lb

## 2015-12-12 DIAGNOSIS — Z79890 Hormone replacement therapy (postmenopausal): Secondary | ICD-10-CM

## 2015-12-12 DIAGNOSIS — E669 Obesity, unspecified: Secondary | ICD-10-CM | POA: Diagnosis not present

## 2015-12-12 DIAGNOSIS — Z23 Encounter for immunization: Secondary | ICD-10-CM

## 2015-12-12 DIAGNOSIS — Z5181 Encounter for therapeutic drug level monitoring: Secondary | ICD-10-CM | POA: Diagnosis not present

## 2015-12-12 DIAGNOSIS — Z0000 Encounter for general adult medical examination without abnormal findings: Secondary | ICD-10-CM

## 2015-12-12 DIAGNOSIS — M1711 Unilateral primary osteoarthritis, right knee: Secondary | ICD-10-CM | POA: Diagnosis not present

## 2015-12-12 DIAGNOSIS — N951 Menopausal and female climacteric states: Secondary | ICD-10-CM

## 2015-12-12 DIAGNOSIS — R232 Flushing: Secondary | ICD-10-CM

## 2015-12-12 DIAGNOSIS — Z8739 Personal history of other diseases of the musculoskeletal system and connective tissue: Secondary | ICD-10-CM

## 2015-12-12 LAB — POCT URINALYSIS DIPSTICK
Bilirubin, UA: NEGATIVE
Blood, UA: NEGATIVE
GLUCOSE UA: NEGATIVE
Ketones, UA: NEGATIVE
LEUKOCYTES UA: NEGATIVE
NITRITE UA: NEGATIVE
PROTEIN UA: NEGATIVE
SPEC GRAV UA: 1.02
UROBILINOGEN UA: 0.2
pH, UA: 7

## 2015-12-12 NOTE — Patient Instructions (Addendum)
It was a pleasure to see you today. Flu vaccine given. Continue diet and exercise efforts. Return 1 year or as needed. Annual mammogram has been done recently and reviewed. Continue same meds.

## 2015-12-12 NOTE — Progress Notes (Signed)
Subjective:    Patient ID: Natalie Parker, female    DOB: [birth year], 64 y.o.   MRN: [id number]  HPI 65 year old White Female in today for health maintenance exam and evaluation of medical issues. History of right knee pain likely due to primary osteoarthritis of right knee. Has been going to physical therapy recently. Also swims at [facility]. Weight is the same at 225 pounds this last year. She is now retired. Has been trying to eat more healthy and cholesterol is normal. However needs to lose some weight. Says she wants to lose 50 pounds. Had recent mammogram that was normal. Flu vaccine given today.  Says she did not get Zostavax vaccine because it was not covered by insurance and was well over $200. She has decided to wait until she goes on Medicare and tried to see if it will be covered.  She has a history of lumbar back pain status post lumbar surgery per Dr. Vertell Limber 2015. MRI in 2015 showed issues with bulging discs and neural foramina involvement L2-S1. History of osteopenia, spinal stenosis, hot flashes and insomnia. She takes hydrocodone/APAP sparingly for musculoskeletal pain. She is taking Flexeril at bedtime as well. She tried Effexor for hot flashes but it caused a headache. She was on estrogen replacement for a number of years but we took her off of it about 3 years ago. Menopausal symptoms were so severe that we restarted Prempro. She was depressed and we started her on Celexa.  Past medical history: Tonsillectomy 1974,  Gets annual mammogram. History of osteopenia. Had colonoscopy by Dr. Watt Climes April 2015. History of hyperplastic polyps in 2004 on colonoscopy by Dr. Olevia Perches but none found in 2015.  She had right knee surgery by Dr. Linden Dolin in 2010 with abrasion chondroplasty of the patella, lateral tibial plateau and medial femoral condyle. She had synovectomy of the suprapatellar pouch and medial joint right knee.  Social history: She is married. 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 Has  smoked a half pack daily for a number of years but quit smoking about 4 years ago. No children.  Family history: Father died at age 25 of a brain tumor. 2 brothers in good health. Mother died at age 95 of unknown causes with history of psoriatic arthritis.         Review of Systems  Constitutional: Negative.   HENT: Negative.   Respiratory: Negative.   Cardiovascular: Negative.   Genitourinary: Negative.   Musculoskeletal:       Occasional back pain and issues with right knee pain.  Neurological: Negative.   Hematological: Negative.   Psychiatric/Behavioral: Negative.        Objective:   Physical Exam  Constitutional: She is oriented to person, place, and time. She appears well-developed and well-nourished. No distress.  HENT:  Head: Normocephalic and atraumatic.  Right Ear: External ear normal.  Left Ear: External ear normal.  Mouth/Throat: Oropharynx is clear and moist. No oropharyngeal exudate.  Eyes: Conjunctivae and EOM are normal. Pupils are equal, round, and reactive to light. Right eye exhibits no discharge. Left eye exhibits no discharge. No scleral icterus.  Neck: Neck supple. No JVD present.  Cardiovascular: Normal rate, regular rhythm, normal heart sounds and intact distal pulses.   No murmur heard. Pulmonary/Chest: Effort normal and breath sounds normal. She has no wheezes. She has no rales.  Abdominal: Soft. Bowel sounds are normal. She exhibits no distension and no mass. There is no tenderness. There is no rebound and no guarding.  Genitourinary:  Pap done 2015. Bimanual normal.  Musculoskeletal: She exhibits no edema.  Lymphadenopathy:    She has no cervical adenopathy.  Neurological: She is alert and oriented to person, place, and time. She has normal reflexes. No cranial nerve deficit. Coordination normal.  Skin: Skin is warm and dry. No rash noted. She is not diaphoretic.  Psychiatric: She has a normal mood and affect. Her behavior is normal. Judgment  and thought content normal.  Vitals reviewed.         Assessment & Plan:  Right knee osteoarthritis  Obesity  History of back pain  Estrogen replacement  History of hot flashes  Plan: Continue same medications and return in one year or as needed. Flu vaccine given today. Did not get Zostavax vaccine because it was going to be expensive and insurance would not cover it. Says she will wait until she goes on Medicare at age 36 for that.

## 2016-04-15 ENCOUNTER — Telehealth: Payer: Self-pay | Admitting: Internal Medicine

## 2016-04-15 ENCOUNTER — Ambulatory Visit (INDEPENDENT_AMBULATORY_CARE_PROVIDER_SITE_OTHER): Payer: Managed Care, Other (non HMO) | Admitting: Internal Medicine

## 2016-04-15 ENCOUNTER — Encounter: Payer: Self-pay | Admitting: Internal Medicine

## 2016-04-15 ENCOUNTER — Other Ambulatory Visit (HOSPITAL_COMMUNITY)
Admission: RE | Admit: 2016-04-15 | Discharge: 2016-04-15 | Disposition: A | Discharge status: Home / Self Care | Payer: Managed Care, Other (non HMO) | Source: Ambulatory Visit | Attending: Internal Medicine | Admitting: Internal Medicine

## 2016-04-15 VITALS — BP 118/70 | HR 94 | Temp 98.2°F | Resp 18 | Ht 68.0 in | Wt 201.0 lb

## 2016-04-15 DIAGNOSIS — N950 Postmenopausal bleeding: Secondary | ICD-10-CM | POA: Diagnosis not present

## 2016-04-15 DIAGNOSIS — Z01411 Encounter for gynecological examination (general) (routine) with abnormal findings: Secondary | ICD-10-CM | POA: Insufficient documentation

## 2016-04-15 DIAGNOSIS — R1084 Generalized abdominal pain: Secondary | ICD-10-CM | POA: Diagnosis not present

## 2016-04-15 DIAGNOSIS — N898 Other specified noninflammatory disorders of vagina: Secondary | ICD-10-CM

## 2016-04-15 LAB — POCT URINALYSIS DIPSTICK
BILIRUBIN UA: NEGATIVE
Glucose, UA: NEGATIVE
KETONES UA: 80
Leukocytes, UA: NEGATIVE
Nitrite, UA: NEGATIVE
PROTEIN UA: NEGATIVE
SPEC GRAV UA: 1.015
Urobilinogen, UA: 0.2
pH, UA: 6.5

## 2016-04-15 LAB — CBC WITH DIFFERENTIAL/PLATELET
BASOS ABS: 0 {cells}/uL (ref 0–200)
Basophils Relative: 0 %
EOS ABS: 126 {cells}/uL (ref 15–500)
EOS PCT: 2 %
HCT: 41.4 % (ref 35.0–45.0)
HEMOGLOBIN: 14 g/dL (ref 11.7–15.5)
LYMPHS ABS: 2457 {cells}/uL (ref 850–3900)
Lymphocytes Relative: 39 %
MCH: 31.6 pg (ref 27.0–33.0)
MCHC: 33.8 g/dL (ref 32.0–36.0)
MCV: 93.5 fL (ref 80.0–100.0)
MONO ABS: 567 {cells}/uL (ref 200–950)
MPV: 8.7 fL (ref 7.5–12.5)
Monocytes Relative: 9 %
NEUTROS PCT: 50 %
Neutro Abs: 3150 cells/uL (ref 1500–7800)
PLATELETS: 273 10*3/uL (ref 140–400)
RBC: 4.43 MIL/uL (ref 3.80–5.10)
RDW: 13.2 % (ref 11.0–15.0)
WBC: 6.3 10*3/uL (ref 3.8–10.8)

## 2016-04-15 NOTE — Progress Notes (Signed)
   Subjective:    Patient ID: Natalie Parker, female    DOB: Aug 16, 1951, 65 y.o.   MRN: [id number]  HPI Patient is noted recent pink vaginal discharge. No significant abdominal pain. She is on estrogen replacement for hot flashes consisting of Prempro. I was reluctant to put her on this but she did not receive relief with other therapies. This is worked well for her. Last Pap smear was in 2015.  Also having issues with leg cramps. Recommended magnesium supplement.  Old records indicate she saw Dr. Dellis Filbert in 2009.  Has chronic low back pain status post surgery by Dr. Vertell Limber in 2015.  Last had physical exam here January 2017. Pap was not taken at that time.    Review of Systems     Objective:   Physical Exam   NFEG .  Pap smear taken. No masses on bimanual exam. Rectovaginal confirms. CBC with differential drawn and pending.      Assessment & Plan:  Postmenopausal bleeding on estrogen replacement  Leg cramps-try magnesium supplement over-the-counter  Plan: Refer to GYN. Explained patient she likely will need endometrial biopsy and further evaluation.

## 2016-04-15 NOTE — Telephone Encounter (Signed)
Spoke with Tiffany @ Dr. Assunta Curtis office @ [phone].  Refer for dx:  Post menopausal bleeding.  Last seen there in 2009.   They will mail patient paperwork since it has been so long since last seen.   Faxing notes to Dr. Assunta Curtis office @ [phone].  CBC drawn today and pending.   Appointment made for 6/16 @ 3pm with Dr. Dellis Filbert.  Patient provided with date/time, phone #.  Patient is aware of location, etc.

## 2016-04-19 ENCOUNTER — Telehealth: Payer: Self-pay

## 2016-04-19 LAB — CYTOLOGY - PAP

## 2016-04-19 NOTE — Telephone Encounter (Signed)
Patient notified

## 2016-04-19 NOTE — Telephone Encounter (Signed)
Patient states that she is taking the hormone replacement prempro which she said you told her could be causing the problems that she is having. She states that she wants to know if she should wean herself off of it before she sees GYN. Also she states that she has not had any problems since her appt here and is it necessary for her to see them. She was advised to keep appt with them unless she heard different from me. She states that she will not cancel the appt unless you feel it is ok to do so. Please advise.

## 2016-04-19 NOTE — Telephone Encounter (Signed)
She must see GYN even though bleeding stopped. May continue Prempro until appt.

## 2016-11-10 ENCOUNTER — Other Ambulatory Visit: Payer: Self-pay | Admitting: Internal Medicine

## 2016-11-10 DIAGNOSIS — Z1231 Encounter for screening mammogram for malignant neoplasm of breast: Secondary | ICD-10-CM

## 2016-12-17 ENCOUNTER — Ambulatory Visit
Admission: RE | Admit: 2016-12-17 | Discharge: 2016-12-17 | Disposition: A | Discharge status: Home / Self Care | Payer: Managed Care, Other (non HMO) | Source: Ambulatory Visit | Attending: Internal Medicine | Admitting: Internal Medicine

## 2016-12-17 DIAGNOSIS — Z1231 Encounter for screening mammogram for malignant neoplasm of breast: Secondary | ICD-10-CM

## 2016-12-31 ENCOUNTER — Other Ambulatory Visit: Payer: Managed Care, Other (non HMO) | Admitting: Internal Medicine

## 2016-12-31 DIAGNOSIS — Z13220 Encounter for screening for lipoid disorders: Secondary | ICD-10-CM

## 2016-12-31 DIAGNOSIS — Z1329 Encounter for screening for other suspected endocrine disorder: Secondary | ICD-10-CM

## 2016-12-31 DIAGNOSIS — M8580 Other specified disorders of bone density and structure, unspecified site: Secondary | ICD-10-CM

## 2016-12-31 DIAGNOSIS — Z0000 Encounter for general adult medical examination without abnormal findings: Secondary | ICD-10-CM

## 2016-12-31 DIAGNOSIS — Z1321 Encounter for screening for nutritional disorder: Secondary | ICD-10-CM

## 2016-12-31 LAB — LIPID PANEL
CHOL/HDL RATIO: 2.8 ratio (ref <5.0)
CHOLESTEROL: 193 mg/dL (ref <200)
HDL: 70 mg/dL (ref >50)
LDL Cholesterol: 111 mg/dL — ABNORMAL HIGH (ref <100)
Triglycerides: 61 mg/dL (ref <150)
VLDL: 12 mg/dL (ref <30)

## 2016-12-31 LAB — CBC WITH DIFFERENTIAL/PLATELET
BASOS PCT: 0 %
Basophils Absolute: 0 cells/uL (ref 0–200)
EOS ABS: 112 {cells}/uL (ref 15–500)
Eosinophils Relative: 2 %
HCT: 42.3 % (ref 35.0–45.0)
Hemoglobin: 14 g/dL (ref 11.7–15.5)
LYMPHS ABS: 2016 {cells}/uL (ref 850–3900)
Lymphocytes Relative: 36 %
MCH: 31.5 pg (ref 27.0–33.0)
MCHC: 33.1 g/dL (ref 32.0–36.0)
MCV: 95.1 fL (ref 80.0–100.0)
MONO ABS: 560 {cells}/uL (ref 200–950)
MPV: 8.7 fL (ref 7.5–12.5)
Monocytes Relative: 10 %
NEUTROS ABS: 2912 {cells}/uL (ref 1500–7800)
Neutrophils Relative %: 52 %
PLATELETS: 248 10*3/uL (ref 140–400)
RBC: 4.45 MIL/uL (ref 3.80–5.10)
RDW: 13.2 % (ref 11.0–15.0)
WBC: 5.6 10*3/uL (ref 3.8–10.8)

## 2016-12-31 LAB — COMPREHENSIVE METABOLIC PANEL
ALK PHOS: 41 U/L (ref 33–130)
ALT: 30 U/L — AB (ref 6–29)
AST: 29 U/L (ref 10–35)
Albumin: 4.3 g/dL (ref 3.6–5.1)
BUN: 21 mg/dL (ref 7–25)
CALCIUM: 9.2 mg/dL (ref 8.6–10.4)
CO2: 26 mmol/L (ref 20–31)
Chloride: 103 mmol/L (ref 98–110)
Creat: 0.62 mg/dL (ref 0.50–0.99)
Glucose, Bld: 81 mg/dL (ref 65–99)
POTASSIUM: 4.2 mmol/L (ref 3.5–5.3)
Sodium: 139 mmol/L (ref 135–146)
TOTAL PROTEIN: 6.8 g/dL (ref 6.1–8.1)
Total Bilirubin: 0.4 mg/dL (ref 0.2–1.2)

## 2016-12-31 LAB — TSH: TSH: 1.42 mIU/L

## 2017-01-01 LAB — VITAMIN D 25 HYDROXY (VIT D DEFICIENCY, FRACTURES): VIT D 25 HYDROXY: 48 ng/mL (ref 30–100)

## 2017-01-04 ENCOUNTER — Ambulatory Visit (INDEPENDENT_AMBULATORY_CARE_PROVIDER_SITE_OTHER): Payer: Managed Care, Other (non HMO) | Admitting: Internal Medicine

## 2017-01-04 ENCOUNTER — Encounter: Payer: Self-pay | Admitting: Internal Medicine

## 2017-01-04 ENCOUNTER — Other Ambulatory Visit (HOSPITAL_COMMUNITY)
Admission: RE | Admit: 2017-01-04 | Discharge: 2017-01-04 | Disposition: A | Discharge status: Home / Self Care | Payer: Managed Care, Other (non HMO) | Source: Ambulatory Visit | Attending: Internal Medicine | Admitting: Internal Medicine

## 2017-01-04 VITALS — BP 100/70 | HR 76 | Ht 67.0 in | Wt 181.0 lb

## 2017-01-04 DIAGNOSIS — R232 Flushing: Secondary | ICD-10-CM | POA: Diagnosis not present

## 2017-01-04 DIAGNOSIS — Z0000 Encounter for general adult medical examination without abnormal findings: Secondary | ICD-10-CM | POA: Diagnosis not present

## 2017-01-04 DIAGNOSIS — Z1151 Encounter for screening for human papillomavirus (HPV): Secondary | ICD-10-CM | POA: Insufficient documentation

## 2017-01-04 DIAGNOSIS — Z23 Encounter for immunization: Secondary | ICD-10-CM | POA: Diagnosis not present

## 2017-01-04 DIAGNOSIS — N898 Other specified noninflammatory disorders of vagina: Secondary | ICD-10-CM | POA: Diagnosis not present

## 2017-01-04 DIAGNOSIS — Z01419 Encounter for gynecological examination (general) (routine) without abnormal findings: Secondary | ICD-10-CM | POA: Insufficient documentation

## 2017-01-04 DIAGNOSIS — M1711 Unilateral primary osteoarthritis, right knee: Secondary | ICD-10-CM

## 2017-01-04 DIAGNOSIS — Z124 Encounter for screening for malignant neoplasm of cervix: Secondary | ICD-10-CM | POA: Diagnosis not present

## 2017-01-04 LAB — POCT URINALYSIS DIPSTICK
BILIRUBIN UA: NEGATIVE
Blood, UA: NEGATIVE
Glucose, UA: NEGATIVE
KETONES UA: NEGATIVE
LEUKOCYTES UA: NEGATIVE
Nitrite, UA: NEGATIVE
PH UA: 7
Protein, UA: NEGATIVE
SPEC GRAV UA: 1.015
Urobilinogen, UA: NEGATIVE

## 2017-01-04 NOTE — Progress Notes (Signed)
Subjective:    Patient ID: Natalie Parker, female    DOB: 1951/01/11, 66 y.o.   MRN: [id number]  HPI  66 year old White Female for health maintenance exam and evaluation of medical issues. Started water aerobics and lost about 50 pounds last year. Some right knee pain which is chronic. Has seen Dr. Delfino Lovett last year and was prescribed Meloxicam. Sees Dr. Hardin Negus for pain management and is prescribed hydrocodone/APAP which she takes 2-3 a day.She also takes gabapentin, Flexeril and uses a Lidoderm patch. She continues on meloxicam.  Patient has lost considerable amount of weight since retiring. She now has time to exercise. In January 2017 she weighed 225 pounds. Now weighs 181 pounds. She swims at the [facility].  She has a history of lumbar back pain status post lumbar surgery per Dr. Vertell Limber in 2015. MRI in 2015 showed bulging disc and neural foramina involvement L2-S1. History of osteopenia, spinal stenosis, hot flashes and insomnia. She tried Effexor for hot flashes but it caused a headache. She was on estrogen replacement for a number of years but we took her off vomit. Menopausal symptoms were so severe that we restarted Prempro. She was depressed and we started her on Celexa. She is no longer taking Celexa.  Past medical history: Tonsillectomy 1974. History of osteopenia. A colonoscopy by Dr. Watt Climes April 2015. History of hyperplastic polyps in 2004 on colonoscopy by Dr. Olevia Perches but none found in 2015.  She had right knee surgery by Dr. Dellis Filbert in 2010 with abrasion chondroplasty of the patella, lateral tibial plateau and medial femoral condyle. She had synovectomy of the suprapatellar pouch and medial joint right knee.  Social history: She is married. 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 She quit smoking around 2013 after smoking a half pack daily for a number of years. No children.  Family history: Father died at age 22 of a brain tumor. 2 brothers in good health. Mother  died at age 73 of unknown causes with history of psoriatic arthritis.    Review of Systems  Constitutional: Negative.   Respiratory: Negative.   Cardiovascular: Negative.   Gastrointestinal: Negative.   Genitourinary: Negative.   Musculoskeletal: Positive for arthralgias.  Neurological: Negative.   Psychiatric/Behavioral: Negative.   All other systems reviewed and are negative.      Objective:   Physical Exam  Constitutional: She is oriented to person, place, and time. She appears well-developed and well-nourished. No distress.  HENT:  Head: Normocephalic and atraumatic.  Right Ear: External ear normal.  Left Ear: External ear normal.  Mouth/Throat: Oropharynx is clear and moist.  Eyes: Conjunctivae and EOM are normal. Pupils are equal, round, and reactive to light. Right eye exhibits no discharge. Left eye exhibits no discharge. No scleral icterus.  Neck: Neck supple. No JVD present. No thyromegaly present.  Cardiovascular: Normal rate, normal heart sounds and intact distal pulses.   No murmur heard. Pulmonary/Chest: Breath sounds normal. She has no wheezes.  Breasts normal Female  Abdominal: Soft. Bowel sounds are normal. She exhibits no distension. There is no tenderness. There is no rebound and no guarding.  Genitourinary:  Genitourinary Comments: Pap taken. Bimanual normal.  Musculoskeletal: She exhibits no edema.  Lymphadenopathy:    She has no cervical adenopathy.  Neurological: She is alert and oriented to person, place, and time. She has normal reflexes. No cranial nerve deficit. Coordination normal.  Skin: Skin is warm and dry. No rash noted. She is not diaphoretic.  Psychiatric: She has a normal mood and affect. Her behavior is normal. Judgment and thought content normal.  Vitals reviewed.         Assessment & Plan:  Osteoarthritis of right knee treated by pain management  History of low back pain status post lumbar surgery  Estrogen replacement with  history of hot flashes  Plan: Continue same medications and return in one year or as needed. Lab work reviewed. LDL cholesterol is 111 she has HDL cholesterol of 70 and a total cholesterol of 193. Triglycerides are normal.

## 2017-01-06 LAB — CYTOLOGY - PAP
Diagnosis: NEGATIVE
HPV: NOT DETECTED

## 2017-01-25 NOTE — Patient Instructions (Signed)
It was a pleasure to see you today. Continue same medications and return in one year or as needed. Have annual mammogram. Recommend annual flu vaccine.

## 2018-01-04 ENCOUNTER — Other Ambulatory Visit: Payer: Self-pay | Admitting: Internal Medicine

## 2018-01-04 DIAGNOSIS — Z1231 Encounter for screening mammogram for malignant neoplasm of breast: Secondary | ICD-10-CM

## 2018-01-06 ENCOUNTER — Ambulatory Visit
Admission: RE | Admit: 2018-01-06 | Discharge: 2018-01-06 | Disposition: A | Discharge status: Home / Self Care | Payer: Managed Care, Other (non HMO) | Source: Ambulatory Visit | Attending: Internal Medicine | Admitting: Internal Medicine

## 2018-01-06 DIAGNOSIS — Z1231 Encounter for screening mammogram for malignant neoplasm of breast: Secondary | ICD-10-CM

## 2018-01-19 ENCOUNTER — Other Ambulatory Visit: Payer: Self-pay

## 2018-01-19 DIAGNOSIS — Z0000 Encounter for general adult medical examination without abnormal findings: Secondary | ICD-10-CM

## 2018-01-19 DIAGNOSIS — E7800 Pure hypercholesterolemia, unspecified: Secondary | ICD-10-CM

## 2018-01-19 DIAGNOSIS — M8580 Other specified disorders of bone density and structure, unspecified site: Secondary | ICD-10-CM

## 2018-01-19 DIAGNOSIS — Z8639 Personal history of other endocrine, nutritional and metabolic disease: Secondary | ICD-10-CM

## 2018-01-31 ENCOUNTER — Other Ambulatory Visit: Payer: Managed Care, Other (non HMO) | Admitting: Internal Medicine

## 2018-02-02 ENCOUNTER — Encounter: Payer: Managed Care, Other (non HMO) | Admitting: Internal Medicine

## 2018-02-07 ENCOUNTER — Other Ambulatory Visit: Payer: PPO | Admitting: Internal Medicine

## 2018-02-07 DIAGNOSIS — M8580 Other specified disorders of bone density and structure, unspecified site: Secondary | ICD-10-CM | POA: Diagnosis not present

## 2018-02-07 DIAGNOSIS — E7800 Pure hypercholesterolemia, unspecified: Secondary | ICD-10-CM | POA: Diagnosis not present

## 2018-02-07 DIAGNOSIS — Z0000 Encounter for general adult medical examination without abnormal findings: Secondary | ICD-10-CM

## 2018-02-07 DIAGNOSIS — Z8639 Personal history of other endocrine, nutritional and metabolic disease: Secondary | ICD-10-CM | POA: Diagnosis not present

## 2018-02-08 LAB — VITAMIN D 25 HYDROXY (VIT D DEFICIENCY, FRACTURES): VIT D 25 HYDROXY: 38 ng/mL (ref 30–100)

## 2018-02-08 LAB — TSH: TSH: 1.34 mIU/L (ref 0.40–4.50)

## 2018-02-08 LAB — CBC WITH DIFFERENTIAL/PLATELET
BASOS ABS: 28 {cells}/uL (ref 0–200)
BASOS PCT: 0.6 %
EOS PCT: 1.9 %
Eosinophils Absolute: 89 cells/uL (ref 15–500)
HEMATOCRIT: 41.2 % (ref 35.0–45.0)
HEMOGLOBIN: 14.4 g/dL (ref 11.7–15.5)
LYMPHS ABS: 2171 {cells}/uL (ref 850–3900)
MCH: 32.1 pg (ref 27.0–33.0)
MCHC: 35 g/dL (ref 32.0–36.0)
MCV: 92 fL (ref 80.0–100.0)
MPV: 8.8 fL (ref 7.5–12.5)
Monocytes Relative: 9.4 %
NEUTROS ABS: 1969 {cells}/uL (ref 1500–7800)
Neutrophils Relative %: 41.9 %
Platelets: 231 10*3/uL (ref 140–400)
RBC: 4.48 10*6/uL (ref 3.80–5.10)
RDW: 12.3 % (ref 11.0–15.0)
Total Lymphocyte: 46.2 %
WBC mixed population: 442 cells/uL (ref 200–950)
WBC: 4.7 10*3/uL (ref 3.8–10.8)

## 2018-02-08 LAB — COMPLETE METABOLIC PANEL WITH GFR
AG Ratio: 1.9 (calc) (ref 1.0–2.5)
ALBUMIN MSPROF: 4.5 g/dL (ref 3.6–5.1)
ALT: 22 U/L (ref 6–29)
AST: 22 U/L (ref 10–35)
Alkaline phosphatase (APISO): 44 U/L (ref 33–130)
BUN: 22 mg/dL (ref 7–25)
CALCIUM: 9.5 mg/dL (ref 8.6–10.4)
CO2: 30 mmol/L (ref 20–32)
Chloride: 104 mmol/L (ref 98–110)
Creat: 0.68 mg/dL (ref 0.50–0.99)
GFR, EST NON AFRICAN AMERICAN: 92 mL/min/{1.73_m2} (ref >=60)
GFR, Est African American: 106 mL/min/{1.73_m2} (ref >=60)
GLOBULIN: 2.4 g/dL (ref 1.9–3.7)
Glucose, Bld: 99 mg/dL (ref 65–99)
Potassium: 4.3 mmol/L (ref 3.5–5.3)
SODIUM: 140 mmol/L (ref 135–146)
TOTAL PROTEIN: 6.9 g/dL (ref 6.1–8.1)
Total Bilirubin: 0.5 mg/dL (ref 0.2–1.2)

## 2018-02-08 LAB — LIPID PANEL
Cholesterol: 223 mg/dL — ABNORMAL HIGH (ref <200)
HDL: 76 mg/dL (ref >50)
LDL Cholesterol (Calc): 130 mg/dL (calc) — ABNORMAL HIGH
NON-HDL CHOLESTEROL (CALC): 147 mg/dL — AB (ref <130)
Total CHOL/HDL Ratio: 2.9 (calc) (ref <5.0)
Triglycerides: 73 mg/dL (ref <150)

## 2018-02-09 ENCOUNTER — Ambulatory Visit (INDEPENDENT_AMBULATORY_CARE_PROVIDER_SITE_OTHER): Payer: PPO | Admitting: Internal Medicine

## 2018-02-09 ENCOUNTER — Encounter: Payer: Self-pay | Admitting: Internal Medicine

## 2018-02-09 VITALS — BP 112/80 | Ht 66.5 in | Wt 183.0 lb

## 2018-02-09 DIAGNOSIS — M8580 Other specified disorders of bone density and structure, unspecified site: Secondary | ICD-10-CM | POA: Diagnosis not present

## 2018-02-09 DIAGNOSIS — E7800 Pure hypercholesterolemia, unspecified: Secondary | ICD-10-CM

## 2018-02-09 DIAGNOSIS — Z23 Encounter for immunization: Secondary | ICD-10-CM

## 2018-02-09 DIAGNOSIS — Z0000 Encounter for general adult medical examination without abnormal findings: Secondary | ICD-10-CM

## 2018-02-09 LAB — POCT URINALYSIS DIPSTICK
Appearance: NORMAL
Bilirubin, UA: NEGATIVE
Blood, UA: NEGATIVE
Glucose, UA: NEGATIVE
Ketones, UA: NEGATIVE
LEUKOCYTES UA: NEGATIVE
NITRITE UA: NEGATIVE
ODOR: NORMAL
PROTEIN UA: NEGATIVE
SPEC GRAV UA: 1.015 (ref 1.010–1.025)
UROBILINOGEN UA: 0.2 U/dL
pH, UA: 6 (ref 5.0–8.0)

## 2018-02-09 NOTE — Progress Notes (Signed)
Subjective:    Patient ID: Natalie Parker, female    DOB: [birth year], 67 y.o.   MRN: [id number]  HPI 67 year old Female for Welcome to Medicare physical exam, routine health maintenance and evaluation of medical issues. Has gained 2 pounds since last years physical exam.  Now weighs 183 pounds.  Weight was 225 in 2017.  She now swims and exercises regularly.  Since retiring she was able to lose weight.  Family history: Father died at age 65 of a brain tumor.  2 brothers in good health.  Mother died at age 41 of unknown causes with history of psoriatic arthritis.  SHx: She is married.  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  She quit smoking around 2013 after smoking 1/2 pack daily for a number of years.  No children.  Husband recently retired.  She has a history of lumbar back pain status post lumbar surgery by Dr. Vertell Limber in 2015.  MRI in 2015 showed bulging disc and neural foraminal involvement L2 through S1.  History of osteopenia, spinal stenosis, hot flashes and insomnia.  She tried Effexor for hot flashes but it caused a headache.  She is to take estrogen replacement.  Past medical history: Tonsillectomy 1974, colonoscopy by Dr. Watt Climes April 2015.  No polyps found.  Right knee surgery in 2010 with abrasion chondroplasty of the patella, lateral tibial plateau and medial femoral condyle.  She had synovectomy of the suprapatellar pouch and medial joint right knee.     Review of Systems  Constitutional: Negative.   All other systems reviewed and are negative.      Objective:   Physical Exam  Genitourinary:  Genitourinary Comments: Pap taken in 2018 and will not be repeated due to age.  Bimanual normal.          Assessment & Plan:  Elevated LDL cholesterol of 130-watch diet  History of lumbar spinal stenosis status post surgery and doing well  History of obesity-weight is stable at 183 pounds.  Used to weigh 225 pounds.  Osteoarthritis of right  knee  Estrogen replacement with history of hot flashes  Plan: Continue to work on diet and exercise efforts.  Watch fat in diet.  Return in 1 year or as needed.  Flu vaccine given.  Subjective:   Patient presents for Medicare Annual/Subsequent preventive examination.  Review Past Medical/Family/Social:   Risk Factors  Current exercise habits:  Dietary issues discussed:   Cardiac risk factors:  Depression Screen  (Note: if answer to either of the following is "Yes", a more complete depression screening is indicated)   Over the past two weeks, have you felt down, depressed or hopeless? No  Over the past two weeks, have you felt little interest or pleasure in doing things? No Have you lost interest or pleasure in daily life? No Do you often feel hopeless? No Do you cry easily over simple problems? No   Activities of Daily Living  In your present state of health, do you have any difficulty performing the following activities?:   Driving? No  Managing money? No  Feeding yourself? No  Getting from bed to chair? No  Climbing a flight of stairs? No  Preparing food and eating?: No  Bathing or showering? No  Getting dressed: No  Getting to the toilet? No  Using the toilet:No  Moving around from place to place: No  In the past year have you fallen or had a near fall?:yes Are you sexually  active? yes Do you have more than one partner? No   Hearing Difficulties: No  Do you often ask people to speak up or repeat themselves? No  Do you experience ringing or noises in your ears? No  Do you have difficulty understanding soft or whispered voices? No  Do you feel that you have a problem with memory? No Do you often misplace items? No    Home Safety:  Do you have a smoke alarm at your residence? Yes Do you have grab bars in the bathroom?  No Do you have throw rugs in your house?  No   Cognitive Testing  Alert? Yes Normal Appearance?Yes  Oriented to person? Yes Place? Yes   Time? Yes  Recall of three objects? Yes  Can perform simple calculations? Yes  Displays appropriate judgment?Yes  Can read the correct time from a watch face?Yes   List the Names of Other Physician/Practitioners you currently use:  See referral list for the physicians patient is currently seeing.     Review of Systems: See above   Objective:     General appearance: Appears stated age  Head: Normocephalic, without obvious abnormality, atraumatic  Eyes: conj clear, EOMi PEERLA  Ears: normal TM's and external ear canals both ears  Nose: Nares normal. Septum midline. Mucosa normal. No drainage or sinus tenderness.  Throat: lips, mucosa, and tongue normal; teeth and gums normal  Neck: no adenopathy, no carotid bruit, no JVD, supple, symmetrical, trachea midline and thyroid not enlarged, symmetric, no tenderness/mass/nodules  No CVA tenderness.  Lungs: clear to auscultation bilaterally  Breasts: normal appearance without masses Heart: regular rate and rhythm, S1, S2 normal, no murmur, click, rub or gallop  Abdomen: soft, non-tender; bowel sounds normal; no masses, no organomegaly  Musculoskeletal: ROM normal in all joints, no crepitus, no deformity, Normal muscle strengthen. Back  is symmetric, no curvature. Skin: Skin color, texture, turgor normal. No rashes or lesions  Lymph nodes: Cervical, supraclavicular, and axillary nodes normal.  Neurologic: CN 2 -12 Normal, Normal symmetric reflexes. Normal coordination and gait  Psych: Alert & Oriented x 3, Mood appear stable.    Assessment:    Annual wellness medicare exam   Plan:    During the course of the visit the patient was educated and counseled about appropriate screening and preventive services including:   Annual mammogram  Flu vaccine given  To get  pneumococcal 23 in about 2 weeks     Patient Instructions (the written plan) was given to the patient.  Medicare Attestation  I have personally reviewed:  The  patient's medical and social history  Their use of alcohol, tobacco or illicit drugs  Their current medications and supplements  The patient's functional ability including ADLs,fall risks, home safety risks, cognitive, and hearing and visual impairment  Diet and physical activities  Evidence for depression or mood disorders  The patient's weight, height, BMI, and visual acuity have been recorded in the chart. I have made referrals, counseling, and provided education to the patient based on review of the above and I have provided the patient with a written personalized care plan for preventive services.

## 2018-02-16 ENCOUNTER — Ambulatory Visit: Payer: PPO | Admitting: Internal Medicine

## 2018-02-17 ENCOUNTER — Ambulatory Visit (INDEPENDENT_AMBULATORY_CARE_PROVIDER_SITE_OTHER): Payer: PPO | Admitting: Internal Medicine

## 2018-02-17 VITALS — BP 110/78 | HR 77 | Temp 98.0°F

## 2018-02-17 DIAGNOSIS — Z23 Encounter for immunization: Secondary | ICD-10-CM

## 2018-02-17 NOTE — Progress Notes (Signed)
   Subjective:    Patient ID: Natalie Parker, female    DOB: Jan 14, 1951, 67 y.o.   MRN: [id number]  HPI Prevnar 13 given today    Review of Systems     Objective:   Physical Exam        Assessment & Plan:

## 2018-02-17 NOTE — Patient Instructions (Signed)
Prevnar 13 given today

## 2018-02-26 NOTE — Patient Instructions (Signed)
It was a pleasure to see you today.  Continue diet and exercise efforts.  Watch fat diet.  Return in 1 year or as needed.

## 2018-03-10 DIAGNOSIS — M47816 Spondylosis without myelopathy or radiculopathy, lumbar region: Secondary | ICD-10-CM | POA: Diagnosis not present

## 2018-03-10 DIAGNOSIS — Z79891 Long term (current) use of opiate analgesic: Secondary | ICD-10-CM | POA: Diagnosis not present

## 2018-03-10 DIAGNOSIS — G894 Chronic pain syndrome: Secondary | ICD-10-CM | POA: Diagnosis not present

## 2018-03-10 DIAGNOSIS — M150 Primary generalized (osteo)arthritis: Secondary | ICD-10-CM | POA: Diagnosis not present

## 2018-04-17 DIAGNOSIS — D225 Melanocytic nevi of trunk: Secondary | ICD-10-CM | POA: Diagnosis not present

## 2018-04-17 DIAGNOSIS — L570 Actinic keratosis: Secondary | ICD-10-CM | POA: Diagnosis not present

## 2018-04-17 DIAGNOSIS — L720 Epidermal cyst: Secondary | ICD-10-CM | POA: Diagnosis not present

## 2018-04-17 DIAGNOSIS — Z86018 Personal history of other benign neoplasm: Secondary | ICD-10-CM | POA: Diagnosis not present

## 2018-04-17 DIAGNOSIS — D1801 Hemangioma of skin and subcutaneous tissue: Secondary | ICD-10-CM | POA: Diagnosis not present

## 2018-04-17 DIAGNOSIS — D2271 Melanocytic nevi of right lower limb, including hip: Secondary | ICD-10-CM | POA: Diagnosis not present

## 2018-04-17 DIAGNOSIS — D2272 Melanocytic nevi of left lower limb, including hip: Secondary | ICD-10-CM | POA: Diagnosis not present

## 2018-04-17 DIAGNOSIS — L814 Other melanin hyperpigmentation: Secondary | ICD-10-CM | POA: Diagnosis not present

## 2018-04-17 DIAGNOSIS — L821 Other seborrheic keratosis: Secondary | ICD-10-CM | POA: Diagnosis not present

## 2018-06-09 DIAGNOSIS — G894 Chronic pain syndrome: Secondary | ICD-10-CM | POA: Diagnosis not present

## 2018-06-09 DIAGNOSIS — M150 Primary generalized (osteo)arthritis: Secondary | ICD-10-CM | POA: Diagnosis not present

## 2018-06-09 DIAGNOSIS — Z79891 Long term (current) use of opiate analgesic: Secondary | ICD-10-CM | POA: Diagnosis not present

## 2018-06-09 DIAGNOSIS — M47816 Spondylosis without myelopathy or radiculopathy, lumbar region: Secondary | ICD-10-CM | POA: Diagnosis not present

## 2018-08-10 ENCOUNTER — Encounter: Payer: Self-pay | Admitting: Internal Medicine

## 2018-08-10 ENCOUNTER — Ambulatory Visit (INDEPENDENT_AMBULATORY_CARE_PROVIDER_SITE_OTHER): Payer: PPO | Admitting: Internal Medicine

## 2018-08-10 VITALS — BP 120/70 | HR 74 | Temp 98.2°F | Ht 66.5 in | Wt 181.0 lb

## 2018-08-10 DIAGNOSIS — H60311 Diffuse otitis externa, right ear: Secondary | ICD-10-CM

## 2018-08-10 DIAGNOSIS — H6503 Acute serous otitis media, bilateral: Secondary | ICD-10-CM | POA: Diagnosis not present

## 2018-08-10 DIAGNOSIS — Z23 Encounter for immunization: Secondary | ICD-10-CM | POA: Diagnosis not present

## 2018-08-10 MED ORDER — AMOXICILLIN 500 MG PO CAPS: 500.0000 mg | ORAL_CAPSULE | Freq: Three times a day (TID) | ORAL | 0 refills | Status: DC

## 2018-08-10 MED ORDER — NEOMYCIN-POLYMYXIN-HC 1 % OT SOLN: OTIC | 0 refills | Status: DC

## 2018-08-10 NOTE — Progress Notes (Signed)
   Subjective:    Patient ID: Natalie Parker, female    DOB: 01-12-1951, 67 y.o.   MRN: [id number]  HPI 67 year old Female in right ear pain onset at 2 AM this morning.  She does go to water aerobics but says she does not put her head under water.  Denies sore throat.  Does cleaning ears with Q-tips.    Review of Systems     Objective:   Physical Exam Pharynx is injected without exudate.  Right external canal is red and there is an acute right serous otitis.  TM is not red but full.  Left TM is full but not as much as the right TM.  Left external canal is slightly red.       Assessment & Plan:  Acute right otitis externa  Acute right serous otitis media  Acute left serous otitis media  Plan: Amoxicillin 500 mg 3 times a day for 10 days.  Cortisporin otic suspension to use in right external ear canal 4 drops 4 times a day for 5 days.  Flu vaccine given.

## 2018-08-10 NOTE — Patient Instructions (Signed)
Flu vaccine given.  Amoxicillin 500 mg 3 times a day for 10 days.  Apply 4 drops of Cortisporin otic suspension to right external ear canal 4 times a day for 5 days

## 2018-09-07 DIAGNOSIS — G894 Chronic pain syndrome: Secondary | ICD-10-CM | POA: Diagnosis not present

## 2018-09-07 DIAGNOSIS — M150 Primary generalized (osteo)arthritis: Secondary | ICD-10-CM | POA: Diagnosis not present

## 2018-09-07 DIAGNOSIS — M47816 Spondylosis without myelopathy or radiculopathy, lumbar region: Secondary | ICD-10-CM | POA: Diagnosis not present

## 2018-09-07 DIAGNOSIS — Z79891 Long term (current) use of opiate analgesic: Secondary | ICD-10-CM | POA: Diagnosis not present

## 2018-10-12 ENCOUNTER — Ambulatory Visit
Admission: RE | Admit: 2018-10-12 | Discharge: 2018-10-12 | Disposition: A | Discharge status: Home / Self Care | Payer: PPO | Source: Ambulatory Visit | Attending: Internal Medicine | Admitting: Internal Medicine

## 2018-10-12 ENCOUNTER — Encounter: Payer: Self-pay | Admitting: Internal Medicine

## 2018-10-12 ENCOUNTER — Ambulatory Visit (INDEPENDENT_AMBULATORY_CARE_PROVIDER_SITE_OTHER): Payer: PPO | Admitting: Internal Medicine

## 2018-10-12 VITALS — BP 120/80 | HR 80 | Temp 98.1°F | Ht 66.5 in | Wt 180.0 lb

## 2018-10-12 DIAGNOSIS — N898 Other specified noninflammatory disorders of vagina: Secondary | ICD-10-CM | POA: Diagnosis not present

## 2018-10-12 DIAGNOSIS — R140 Abdominal distension (gaseous): Secondary | ICD-10-CM | POA: Diagnosis not present

## 2018-10-12 DIAGNOSIS — K219 Gastro-esophageal reflux disease without esophagitis: Secondary | ICD-10-CM | POA: Diagnosis not present

## 2018-10-12 DIAGNOSIS — F439 Reaction to severe stress, unspecified: Secondary | ICD-10-CM | POA: Diagnosis not present

## 2018-10-12 MED ORDER — ESOMEPRAZOLE MAGNESIUM 40 MG PO CPDR: 40.0000 mg | DELAYED_RELEASE_CAPSULE | Freq: Every day | ORAL | 5 refills | Status: DC

## 2018-10-12 MED ORDER — PREDNISONE 10 MG PO TABS: ORAL_TABLET | ORAL | 0 refills | Status: DC

## 2018-10-12 NOTE — Patient Instructions (Addendum)
Take Prednisone in tapering course as directed 6-5-4-3-2-1. Have KUB today. Take Nexium for reflux symptoms.  Norco 5/325 every 12 hours sparingly for back pain.  Addendum: KUB shows constipation.  Advise laxative and then proceed with MiraLAX daily.

## 2018-10-12 NOTE — Progress Notes (Signed)
   Subjective:    Patient ID: Natalie Parker, female    DOB: 1951-04-04, 67 y.o.   MRN: [id number]  HPI 67 year old Female complaining of right lower back pain.  Does not recall any injury.  Seems to be in the right posterior hip area extending into the buttock and upper leg.  No history of heavy lifting recently.  Has been very uncomfortable.    Also brings up a new problem of abdominal bloating/reflux type symptoms.  Denies being constipated.  Has had some situational stress with her husband's granddaughter who was living with them until recently.  She is now pregnant and living with boyfriend.  Review of Systems no numbness or tingling in the right leg.     Objective:   Physical Exam Deep tendon reflexes 2+ in the right knee.  Not tested in the ankle.  Muscle strength in the right lower extremity is 5/5 in all groups tested.  Range of motion in the trunk is fair  Abdomen is soft slightly distended and tympanic without hepatosplenomegaly or significant tenderness.  No organomegaly     Assessment & Plan:  Right sciatica  Abdominal bloating  GE reflux  Situational stress with husband's granddaughter   Plan: Take prednisone and tapering course as directed beginning with 6 tablets on day 1 and decreasing by 1 tablet daily over 6 days.  Norco 5/325 twice daily as needed for severe back pain.  KUB is consistent with constipation showing prominent amount of stool throughout the colon.  Recommend laxative such as Senokot or Dulcolax followed by daily MiraLAX.  Take Nexium for GE reflux symptoms  30 minutes spent with patient with evaluation of these problems

## 2018-12-01 DIAGNOSIS — G894 Chronic pain syndrome: Secondary | ICD-10-CM | POA: Diagnosis not present

## 2018-12-01 DIAGNOSIS — Z79891 Long term (current) use of opiate analgesic: Secondary | ICD-10-CM | POA: Diagnosis not present

## 2018-12-01 DIAGNOSIS — M150 Primary generalized (osteo)arthritis: Secondary | ICD-10-CM | POA: Diagnosis not present

## 2018-12-01 DIAGNOSIS — M47816 Spondylosis without myelopathy or radiculopathy, lumbar region: Secondary | ICD-10-CM | POA: Diagnosis not present

## 2018-12-18 ENCOUNTER — Other Ambulatory Visit: Payer: Self-pay | Admitting: Internal Medicine

## 2018-12-18 DIAGNOSIS — Z1231 Encounter for screening mammogram for malignant neoplasm of breast: Secondary | ICD-10-CM

## 2019-01-12 ENCOUNTER — Ambulatory Visit
Admission: RE | Admit: 2019-01-12 | Discharge: 2019-01-12 | Disposition: A | Discharge status: Home / Self Care | Payer: PPO | Source: Ambulatory Visit | Attending: Internal Medicine | Admitting: Internal Medicine

## 2019-01-12 DIAGNOSIS — Z1231 Encounter for screening mammogram for malignant neoplasm of breast: Secondary | ICD-10-CM | POA: Diagnosis not present

## 2019-02-09 ENCOUNTER — Other Ambulatory Visit: Payer: PPO | Admitting: Internal Medicine

## 2019-02-09 ENCOUNTER — Other Ambulatory Visit: Payer: Self-pay

## 2019-02-09 DIAGNOSIS — M8580 Other specified disorders of bone density and structure, unspecified site: Secondary | ICD-10-CM

## 2019-02-09 DIAGNOSIS — E7800 Pure hypercholesterolemia, unspecified: Secondary | ICD-10-CM

## 2019-02-09 DIAGNOSIS — Z0000 Encounter for general adult medical examination without abnormal findings: Secondary | ICD-10-CM

## 2019-02-10 LAB — COMPLETE METABOLIC PANEL WITH GFR
AG Ratio: 2 (calc) (ref 1.0–2.5)
ALT: 16 U/L (ref 6–29)
AST: 21 U/L (ref 10–35)
Albumin: 4.3 g/dL (ref 3.6–5.1)
Alkaline phosphatase (APISO): 38 U/L (ref 37–153)
BUN: 23 mg/dL (ref 7–25)
CO2: 27 mmol/L (ref 20–32)
Calcium: 9.1 mg/dL (ref 8.6–10.4)
Chloride: 107 mmol/L (ref 98–110)
Creat: 0.62 mg/dL (ref 0.50–0.99)
GFR, Est African American: 109 mL/min/{1.73_m2} (ref >=60)
GFR, Est Non African American: 94 mL/min/{1.73_m2} (ref >=60)
Globulin: 2.1 g/dL (calc) (ref 1.9–3.7)
Glucose, Bld: 90 mg/dL (ref 65–99)
Potassium: 4.2 mmol/L (ref 3.5–5.3)
Sodium: 142 mmol/L (ref 135–146)
Total Bilirubin: 0.3 mg/dL (ref 0.2–1.2)
Total Protein: 6.4 g/dL (ref 6.1–8.1)

## 2019-02-10 LAB — CBC WITH DIFFERENTIAL/PLATELET
ABSOLUTE MONOCYTES: 488 {cells}/uL (ref 200–950)
BASOS PCT: 0.7 %
Basophils Absolute: 32 cells/uL (ref 0–200)
EOS ABS: 129 {cells}/uL (ref 15–500)
Eosinophils Relative: 2.8 %
HCT: 39.5 % (ref 35.0–45.0)
Hemoglobin: 13.7 g/dL (ref 11.7–15.5)
LYMPHS ABS: 2226 {cells}/uL (ref 850–3900)
MCH: 32.5 pg (ref 27.0–33.0)
MCHC: 34.7 g/dL (ref 32.0–36.0)
MCV: 93.8 fL (ref 80.0–100.0)
MPV: 9 fL (ref 7.5–12.5)
Monocytes Relative: 10.6 %
NEUTROS ABS: 1725 {cells}/uL (ref 1500–7800)
Neutrophils Relative %: 37.5 %
Platelets: 227 10*3/uL (ref 140–400)
RBC: 4.21 10*6/uL (ref 3.80–5.10)
RDW: 12.4 % (ref 11.0–15.0)
Total Lymphocyte: 48.4 %
WBC: 4.6 10*3/uL (ref 3.8–10.8)

## 2019-02-10 LAB — LIPID PANEL
Cholesterol: 208 mg/dL — ABNORMAL HIGH (ref <200)
HDL: 75 mg/dL (ref >=50)
LDL Cholesterol (Calc): 117 mg/dL (calc) — ABNORMAL HIGH
Non-HDL Cholesterol (Calc): 133 mg/dL (calc) — ABNORMAL HIGH (ref <130)
TRIGLYCERIDES: 65 mg/dL (ref <150)
Total CHOL/HDL Ratio: 2.8 (calc) (ref <5.0)

## 2019-02-10 LAB — TSH: TSH: 1.79 mIU/L (ref 0.40–4.50)

## 2019-02-12 ENCOUNTER — Ambulatory Visit
Admission: RE | Admit: 2019-02-12 | Discharge: 2019-02-12 | Disposition: A | Discharge status: Home / Self Care | Payer: PPO | Source: Ambulatory Visit | Attending: Internal Medicine | Admitting: Internal Medicine

## 2019-02-12 ENCOUNTER — Other Ambulatory Visit: Payer: Self-pay

## 2019-02-12 ENCOUNTER — Ambulatory Visit (INDEPENDENT_AMBULATORY_CARE_PROVIDER_SITE_OTHER): Payer: PPO | Admitting: Internal Medicine

## 2019-02-12 ENCOUNTER — Encounter: Payer: Self-pay | Admitting: Internal Medicine

## 2019-02-12 VITALS — BP 102/70 | HR 80 | Ht 66.5 in | Wt 186.0 lb

## 2019-02-12 DIAGNOSIS — G8929 Other chronic pain: Secondary | ICD-10-CM

## 2019-02-12 DIAGNOSIS — Z23 Encounter for immunization: Secondary | ICD-10-CM | POA: Diagnosis not present

## 2019-02-12 DIAGNOSIS — K219 Gastro-esophageal reflux disease without esophagitis: Secondary | ICD-10-CM

## 2019-02-12 DIAGNOSIS — E2839 Other primary ovarian failure: Secondary | ICD-10-CM

## 2019-02-12 DIAGNOSIS — M8580 Other specified disorders of bone density and structure, unspecified site: Secondary | ICD-10-CM

## 2019-02-12 DIAGNOSIS — E7800 Pure hypercholesterolemia, unspecified: Secondary | ICD-10-CM

## 2019-02-12 DIAGNOSIS — M19071 Primary osteoarthritis, right ankle and foot: Secondary | ICD-10-CM | POA: Diagnosis not present

## 2019-02-12 DIAGNOSIS — M79671 Pain in right foot: Secondary | ICD-10-CM

## 2019-02-12 DIAGNOSIS — M545 Low back pain, unspecified: Secondary | ICD-10-CM

## 2019-02-12 DIAGNOSIS — Z0000 Encounter for general adult medical examination without abnormal findings: Secondary | ICD-10-CM

## 2019-02-12 LAB — POCT URINALYSIS DIPSTICK
Appearance: NEGATIVE
BILIRUBIN UA: NEGATIVE
Glucose, UA: NEGATIVE
Ketones, UA: NEGATIVE
Leukocytes, UA: NEGATIVE
Nitrite, UA: NEGATIVE
Odor: NEGATIVE
Protein, UA: NEGATIVE
RBC UA: NEGATIVE
Spec Grav, UA: 1.01 (ref 1.010–1.025)
Urobilinogen, UA: 0.2 E.U./dL
pH, UA: 6.5 (ref 5.0–8.0)

## 2019-02-12 NOTE — Progress Notes (Signed)
Subjective:    Patient ID: Natalie Parker, female    DOB: Apr 07, 1951, 68 y.o.   MRN: [id number]  HPI 68 year old Female for health maintenance exam, Medicare wellness and evaluation of medical issues.  She continues to watch her weight.  Has been going to the gym until recently.  Has gained 3 pounds since last year.  Weight was 225 pounds in 2017.  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  New complaint today is right foot pain.  To have x-ray of right foot.  May need to see podiatrist or orthopedist.  Social history: She is married.  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  She quit smoking around 2013 after smoking 1/2 pack daily for a number of years.  No children.  Husband is retired.  Family history: Father died at age 87 of a brain tumor.  2 brothers in good health.  Mother died at age 11 of unknown causes with history of psoriatic arthritis.  She has a history of lumbar back pain status post lumbar surgery by Dr. Vertell Limber in 2015.  MRI in 2015 showed bulging disc and neural foraminal involvement L2-S1.  History of osteopenia, spinal stenosis, hot flashes and insomnia.  She tried Effexor for hot flashes but it caused a headache.  She used to take estrogen replacement but no longer does so.  Past medical history: Tonsillectomy 1974, colonoscopy by Dr. Watt Climes 2015 with no polyps found.  Right knee surgery 2010 with abrasion chondroplasty of the patella, lateral tibial plateau and medial femoral condyle.  She had synovectomy of the suprapatellar pouch and medial joint right knee.    Review of Systems  Constitutional: Negative.   All other systems reviewed and are negative.      Objective:   Physical Exam Vitals signs reviewed.  Constitutional:      General: She is not in acute distress.    Appearance: Normal appearance. She is not diaphoretic.  HENT:     Head: Normocephalic.     Right Ear: Tympanic membrane and external ear normal.     Left  Ear: Tympanic membrane and external ear normal.     Nose: Nose normal.     Mouth/Throat:     Mouth: Mucous membranes are moist.     Pharynx: Oropharynx is clear. No oropharyngeal exudate.  Eyes:     General: No scleral icterus.       Right eye: No discharge.        Left eye: No discharge.     Extraocular Movements: Extraocular movements intact.     Conjunctiva/sclera: Conjunctivae normal.     Pupils: Pupils are equal, round, and reactive to light.  Neck:     Musculoskeletal: Neck supple. No neck rigidity.     Comments: No thyromegaly Cardiovascular:     Rate and Rhythm: Normal rate and regular rhythm.     Heart sounds: Normal heart sounds. No murmur.  Pulmonary:     Effort: Pulmonary effort is normal. No respiratory distress.     Breath sounds: Normal breath sounds. No wheezing or rales.  Abdominal:     General: Bowel sounds are normal. There is no distension.     Palpations: Abdomen is soft. There is no mass.     Tenderness: There is no abdominal tenderness. There is no guarding or rebound.  Genitourinary:    Comments: Pap taken in 2018 and will not be repeated due to age.  Bimanual  normal Musculoskeletal:     Right lower leg: No edema.     Left lower leg: No edema.  Lymphadenopathy:     Cervical: No cervical adenopathy.  Skin:    General: Skin is warm and dry.  Neurological:     General: No focal deficit present.     Mental Status: She is alert and oriented to person, place, and time.     Cranial Nerves: No cranial nerve deficit.     Motor: No weakness.     Gait: Gait normal.  Psychiatric:        Mood and Affect: Mood normal.        Behavior: Behavior normal.        Thought Content: Thought content normal.        Judgment: Judgment normal.           Assessment & Plan:  Pure hypercholesterolemia.  Total cholesterol is 208 with LDL cholesterol 117.  Continue to monitor diet and continue exercise  History of lumbar spinal stenosis status post surgery and doing  well had flareup in November of low back pain treated with prednisone and Norco  History of obesity-stable at 186 pounds-watch weight  Osteoarthritis of right knee  GE reflux treated with Nexium  Pneumococcal 23 vaccine given  Plan: Return in 1 year or as needed.  Subjective:   Patient presents for Medicare Annual/Subsequent preventive examination.  Review Past Medical/Family/Social: See above   Risk Factors  Current exercise habits: Swims and exercises regularly Dietary issues discussed: Low-fat low carbohydrate  Cardiac risk factors: None except elevated LDL  Depression Screen  (Note: if answer to either of the following is "Yes", a more complete depression screening is indicated)   Over the past two weeks, have you felt down, depressed or hopeless? No  Over the past two weeks, have you felt little interest or pleasure in doing things? No Have you lost interest or pleasure in daily life? No Do you often feel hopeless? No Do you cry easily over simple problems? No   Activities of Daily Living  In your present state of health, do you have any difficulty performing the following activities?:   Driving? No  Managing money? No  Feeding yourself? No  Getting from bed to chair? No  Climbing a flight of stairs? No  Preparing food and eating?: No  Bathing or showering? No  Getting dressed: No  Getting to the toilet? No  Using the toilet:No  Moving around from place to place: No  In the past year have you fallen or had a near fall?:No  Are you sexually active? yes Do you have more than one partner? No   Hearing Difficulties: No  Do you often ask people to speak up or repeat themselves? No  Do you experience ringing or noises in your ears? No  Do you have difficulty understanding soft or whispered voices?  Occasionally Do you feel that you have a problem with memory?  Occasionally Do you often misplace items?  Occasionally   Home Safety:  Do you have a smoke alarm  at your residence? Yes Do you have grab bars in the bathroom?  None Do you have throw rugs in your house?  Yes   Cognitive Testing  Alert? Yes Normal Appearance?Yes  Oriented to person? Yes Place? Yes  Time? Yes  Recall of three objects? Yes  Can perform simple calculations? Yes  Displays appropriate judgment?Yes  Can read the correct time from a watch face?Yes  List the Names of Other Physician/Practitioners you currently use:  See referral list for the physicians patient is currently seeing.     Review of Systems: See above   Objective:     General appearance: Appears stated age and mildly obese  Head: Normocephalic, without obvious abnormality, atraumatic  Eyes: conj clear, EOMi PEERLA  Ears: normal TM's and external ear canals both ears  Nose: Nares normal. Septum midline. Mucosa normal. No drainage or sinus tenderness.  Throat: lips, mucosa, and tongue normal; teeth and gums normal  Neck: no adenopathy, no carotid bruit, no JVD, supple, symmetrical, trachea midline and thyroid not enlarged, symmetric, no tenderness/mass/nodules  No CVA tenderness.  Lungs: clear to auscultation bilaterally  Breasts: normal appearance, no masses or tenderness Heart: regular rate and rhythm, S1, S2 normal, no murmur, click, rub or gallop  Abdomen: soft, non-tender; bowel sounds normal; no masses, no organomegaly  Musculoskeletal: ROM normal in all joints, no crepitus, no deformity, Normal muscle strengthen. Back  is symmetric, no curvature. Skin: Skin color, texture, turgor normal. No rashes or lesions  Lymph nodes: Cervical, supraclavicular, and axillary nodes normal.  Neurologic: CN 2 -12 Normal, Normal symmetric reflexes. Normal coordination and gait  Psych: Alert & Oriented x 3, Mood appear stable.    Assessment:    Annual wellness medicare exam   Plan:    During the course of the visit the patient was educated and counseled about appropriate screening and preventive services  including:   Recommend annual flu vaccine  Annual mammogram     Patient Instructions (the written plan) was given to the patient.  Medicare Attestation  I have personally reviewed:  The patient's medical and social history  Their use of alcohol, tobacco or illicit drugs  Their current medications and supplements  The patient's functional ability including ADLs,fall risks, home safety risks, cognitive, and hearing and visual impairment  Diet and physical activities  Evidence for depression or mood disorders  The patient's weight, height, BMI, and visual acuity have been recorded in the chart. I have made referrals, counseling, and provided education to the patient based on review of the above and I have provided the patient with a written personalized care plan for preventive services.

## 2019-02-12 NOTE — Progress Notes (Signed)
   Subjective:    Patient ID: Natalie Parker, female    DOB: 21-Jun-1951, 68 y.o.   MRN: [id number]  HPI    Review of Systems     Objective:   Physical Exam        Assessment & Plan:

## 2019-02-12 NOTE — Patient Instructions (Addendum)
Pneumococcal 23 given. Have foot xrayed.  Continue to work on diet and exercise efforts.  Return in 1 year or as needed.  Pneumococcal vaccine given.  Continue diet and exercise efforts.

## 2019-03-02 DIAGNOSIS — M150 Primary generalized (osteo)arthritis: Secondary | ICD-10-CM | POA: Diagnosis not present

## 2019-03-02 DIAGNOSIS — X32XXXA Exposure to sunlight, initial encounter: Secondary | ICD-10-CM | POA: Diagnosis not present

## 2019-03-02 DIAGNOSIS — D2272 Melanocytic nevi of left lower limb, including hip: Secondary | ICD-10-CM | POA: Diagnosis not present

## 2019-03-02 DIAGNOSIS — E440 Moderate protein-calorie malnutrition: Secondary | ICD-10-CM | POA: Diagnosis not present

## 2019-03-02 DIAGNOSIS — I509 Heart failure, unspecified: Secondary | ICD-10-CM | POA: Diagnosis not present

## 2019-03-02 DIAGNOSIS — M47811 Spondylosis without myelopathy or radiculopathy, occipito-atlanto-axial region: Secondary | ICD-10-CM | POA: Diagnosis not present

## 2019-03-02 DIAGNOSIS — M47812 Spondylosis without myelopathy or radiculopathy, cervical region: Secondary | ICD-10-CM | POA: Diagnosis not present

## 2019-03-02 DIAGNOSIS — N183 Chronic kidney disease, stage 3 (moderate): Secondary | ICD-10-CM | POA: Diagnosis not present

## 2019-03-02 DIAGNOSIS — D2262 Melanocytic nevi of left upper limb, including shoulder: Secondary | ICD-10-CM | POA: Diagnosis not present

## 2019-03-02 DIAGNOSIS — M47816 Spondylosis without myelopathy or radiculopathy, lumbar region: Secondary | ICD-10-CM | POA: Diagnosis not present

## 2019-03-02 DIAGNOSIS — I10 Essential (primary) hypertension: Secondary | ICD-10-CM | POA: Diagnosis not present

## 2019-03-02 DIAGNOSIS — D225 Melanocytic nevi of trunk: Secondary | ICD-10-CM | POA: Diagnosis not present

## 2019-03-02 DIAGNOSIS — D2261 Melanocytic nevi of right upper limb, including shoulder: Secondary | ICD-10-CM | POA: Diagnosis not present

## 2019-03-02 DIAGNOSIS — D2271 Melanocytic nevi of right lower limb, including hip: Secondary | ICD-10-CM | POA: Diagnosis not present

## 2019-03-02 DIAGNOSIS — J1289 Other viral pneumonia: Secondary | ICD-10-CM | POA: Diagnosis not present

## 2019-03-02 DIAGNOSIS — G894 Chronic pain syndrome: Secondary | ICD-10-CM | POA: Diagnosis not present

## 2019-03-02 DIAGNOSIS — S8412XS Injury of peroneal nerve at lower leg level, left leg, sequela: Secondary | ICD-10-CM | POA: Diagnosis not present

## 2019-03-02 DIAGNOSIS — L570 Actinic keratosis: Secondary | ICD-10-CM | POA: Diagnosis not present

## 2019-03-02 DIAGNOSIS — Z23 Encounter for immunization: Secondary | ICD-10-CM | POA: Diagnosis not present

## 2019-03-02 DIAGNOSIS — M546 Pain in thoracic spine: Secondary | ICD-10-CM | POA: Diagnosis not present

## 2019-03-02 DIAGNOSIS — Z79891 Long term (current) use of opiate analgesic: Secondary | ICD-10-CM | POA: Diagnosis not present

## 2019-04-04 IMAGING — MG DIGITAL SCREENING BILATERAL MAMMOGRAM WITH TOMO AND CAD
8 series · 8 of 24 positions shown · non-contrast
Comparison: Previous exam(s).

CLINICAL DATA: Screening.

EXAM:
DIGITAL SCREENING BILATERAL MAMMOGRAM WITH TOMO AND CAD

[L MLO synth-2D]
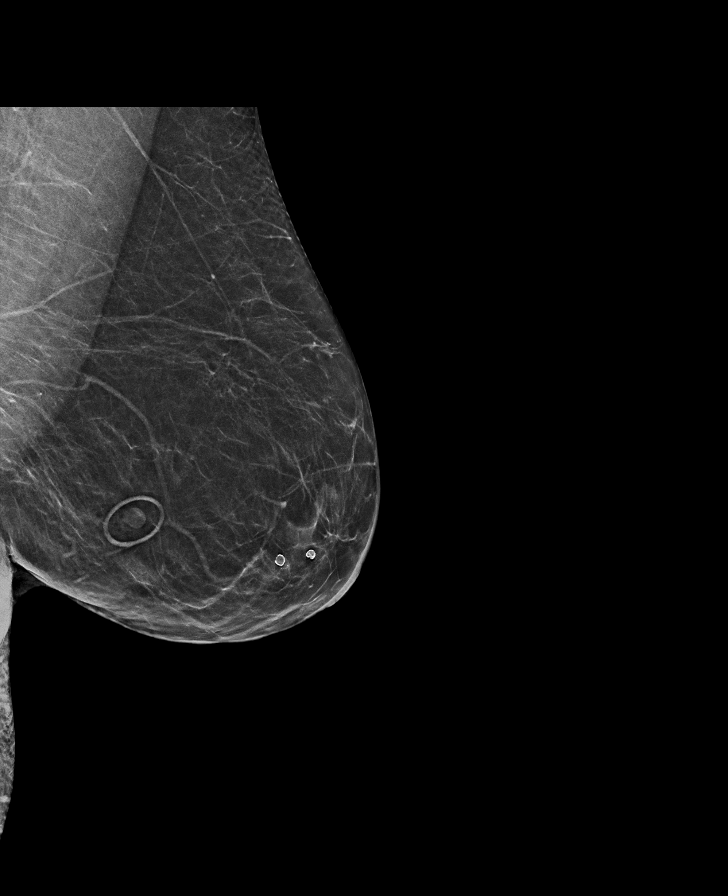

[R CC synth-2D]
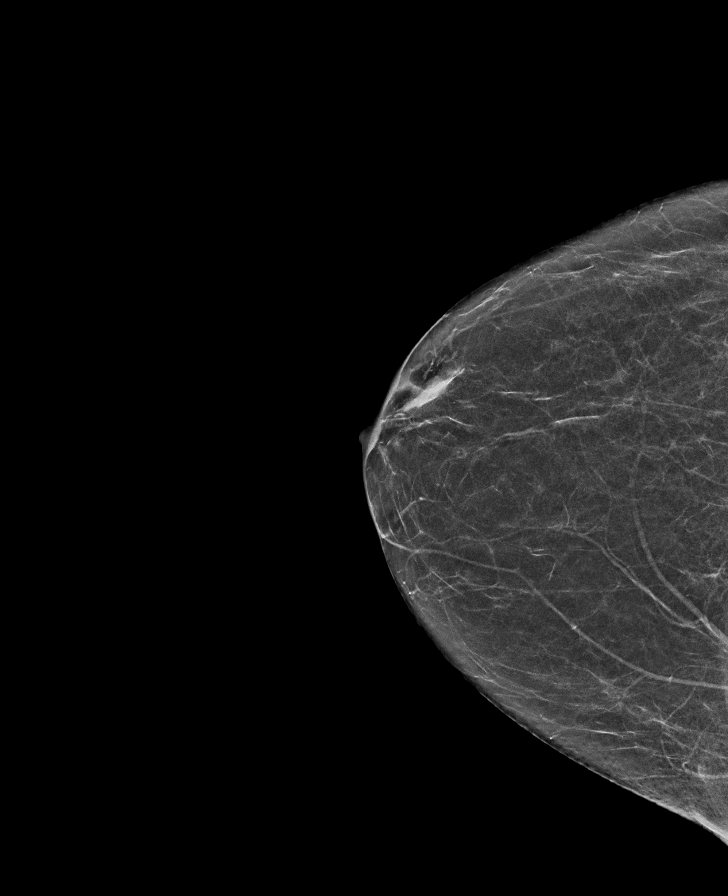

[R MLO synth-2D]
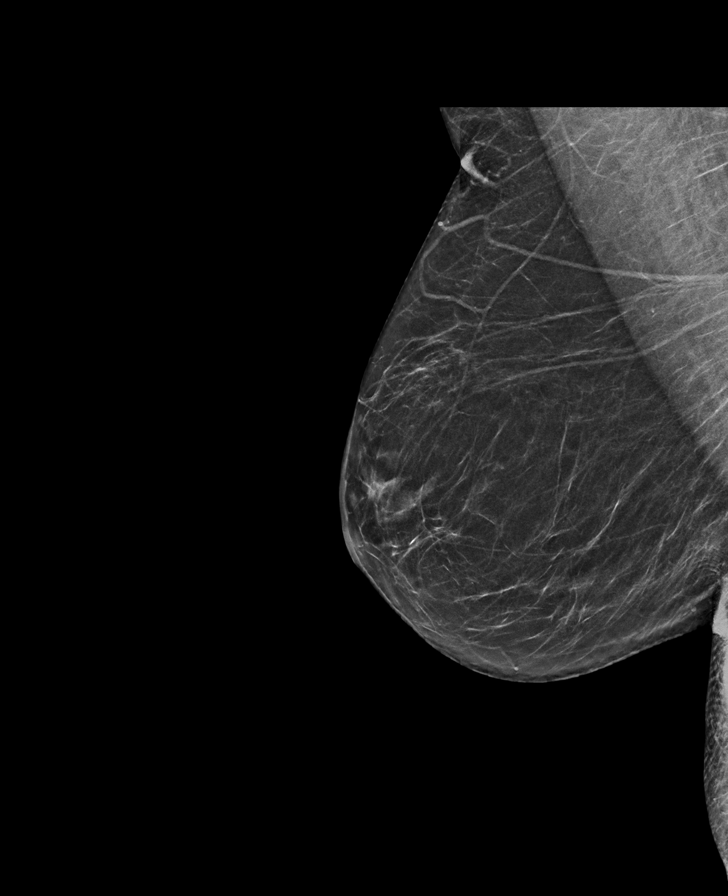

[L CC synth-2D]
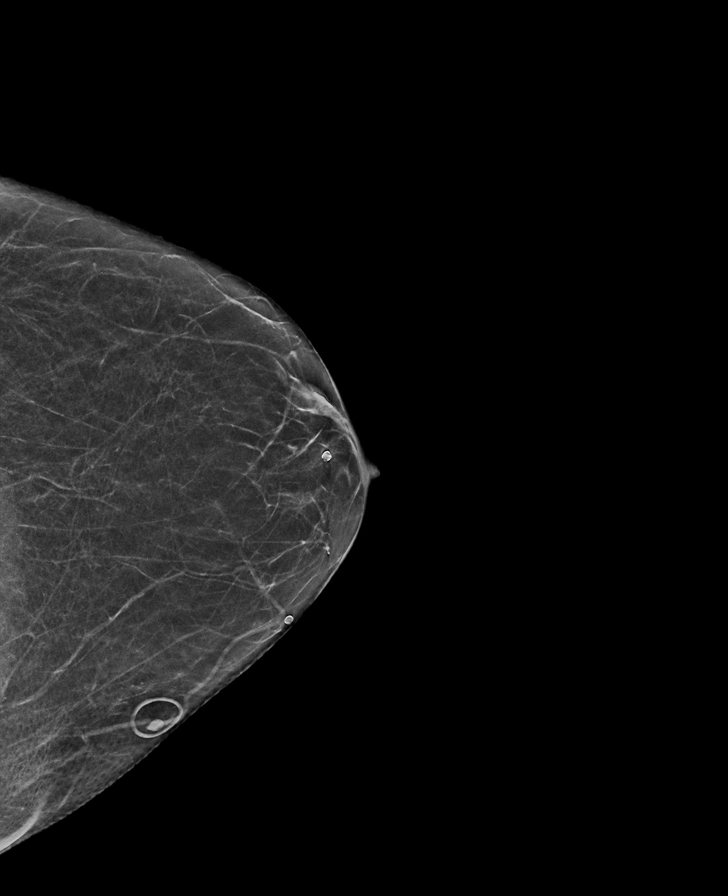

[L MLO tomo · tomo slice 29/58.0]
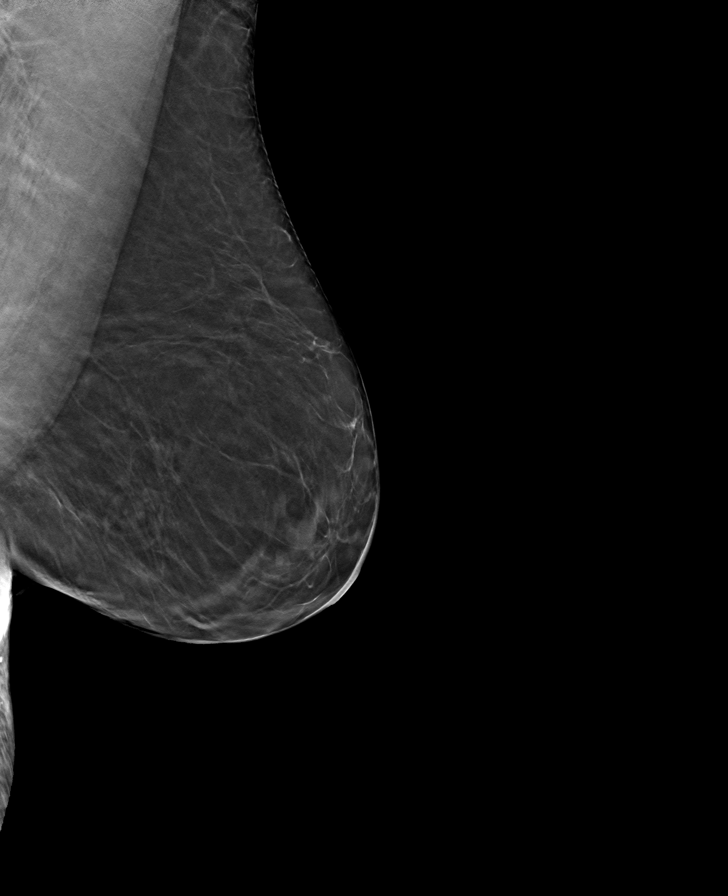

[L CC tomo · tomo slice 25/48.0]
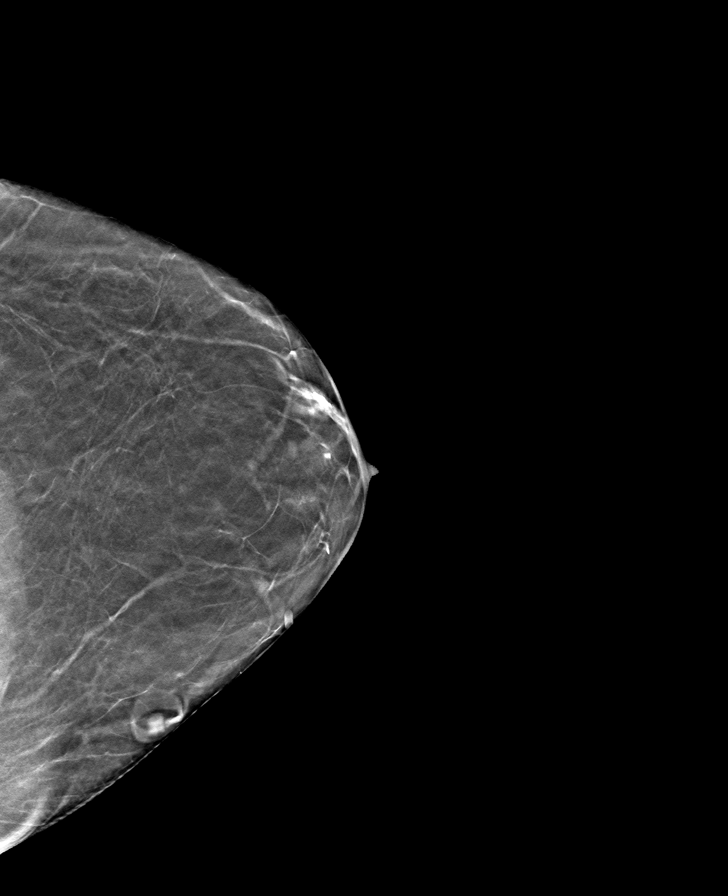

[R MLO tomo · tomo slice 29/58.0]
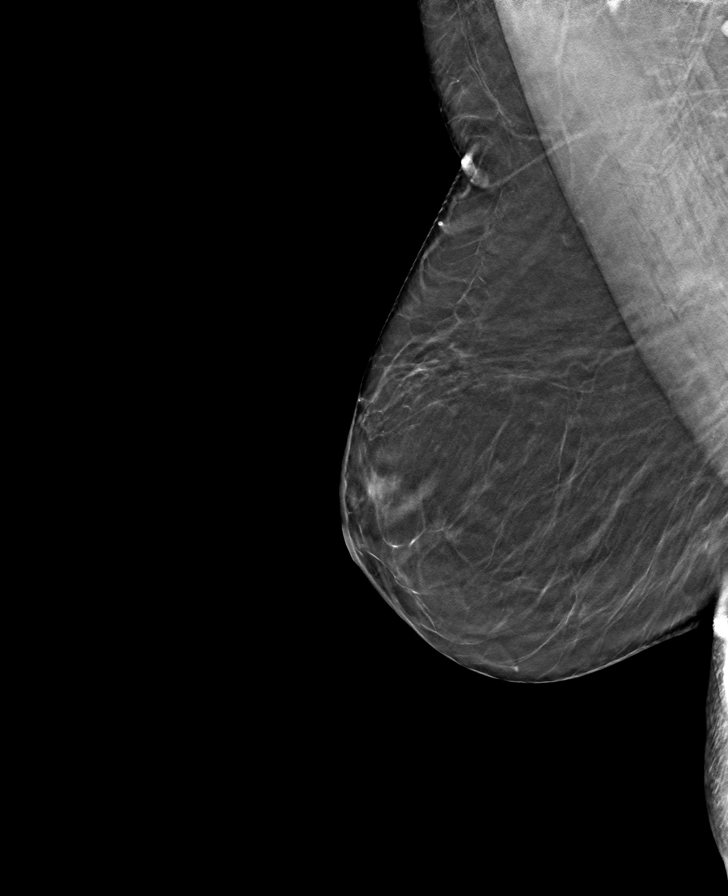

[R CC tomo · tomo slice 26/51.0]
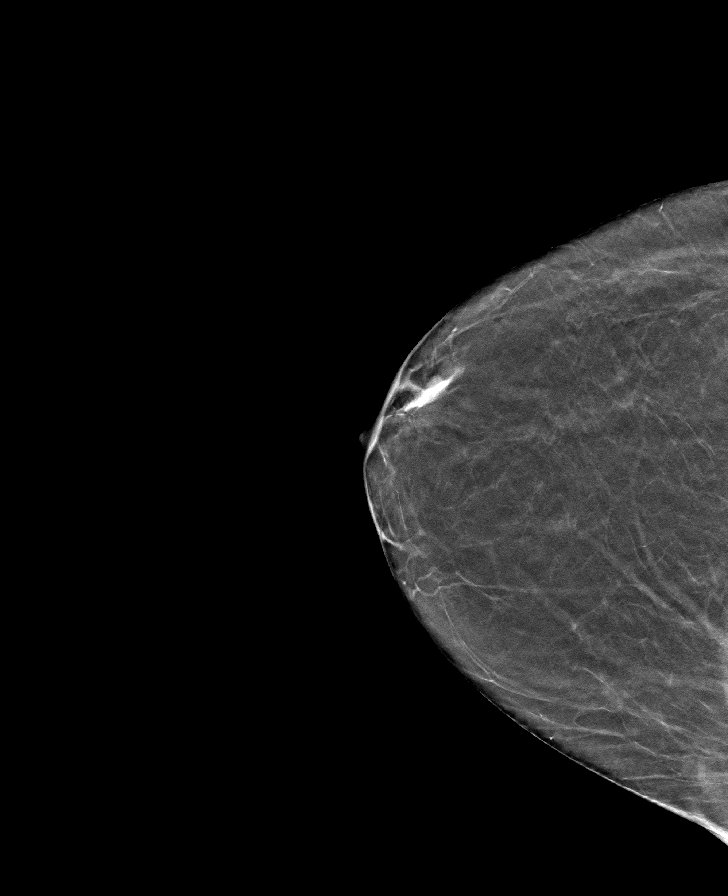

[8 of 24 positions shown; findings below may reference images not displayed]

ACR Breast Density Category b: There are scattered areas of
fibroglandular density.
FINDINGS: There are no findings suspicious for malignancy. Images were
processed with CAD.
IMPRESSION: No mammographic evidence of malignancy. A result letter of this
screening mammogram will be mailed directly to the patient.

RECOMMENDATION:
Screening mammogram in one year. (Code:CN-U-775)

BI-RADS CATEGORY  1: Negative.

## 2019-04-24 DIAGNOSIS — Z86018 Personal history of other benign neoplasm: Secondary | ICD-10-CM | POA: Diagnosis not present

## 2019-04-24 DIAGNOSIS — D225 Melanocytic nevi of trunk: Secondary | ICD-10-CM | POA: Diagnosis not present

## 2019-04-24 DIAGNOSIS — L821 Other seborrheic keratosis: Secondary | ICD-10-CM | POA: Diagnosis not present

## 2019-04-24 DIAGNOSIS — D2271 Melanocytic nevi of right lower limb, including hip: Secondary | ICD-10-CM | POA: Diagnosis not present

## 2019-04-24 DIAGNOSIS — L570 Actinic keratosis: Secondary | ICD-10-CM | POA: Diagnosis not present

## 2019-04-24 DIAGNOSIS — L814 Other melanin hyperpigmentation: Secondary | ICD-10-CM | POA: Diagnosis not present

## 2019-04-24 DIAGNOSIS — D2272 Melanocytic nevi of left lower limb, including hip: Secondary | ICD-10-CM | POA: Diagnosis not present

## 2019-05-25 DIAGNOSIS — M47816 Spondylosis without myelopathy or radiculopathy, lumbar region: Secondary | ICD-10-CM | POA: Diagnosis not present

## 2019-05-25 DIAGNOSIS — Z79891 Long term (current) use of opiate analgesic: Secondary | ICD-10-CM | POA: Diagnosis not present

## 2019-05-25 DIAGNOSIS — G894 Chronic pain syndrome: Secondary | ICD-10-CM | POA: Diagnosis not present

## 2019-05-25 DIAGNOSIS — M150 Primary generalized (osteo)arthritis: Secondary | ICD-10-CM | POA: Diagnosis not present

## 2019-07-05 ENCOUNTER — Other Ambulatory Visit: Payer: Self-pay

## 2019-07-05 ENCOUNTER — Ambulatory Visit
Admission: RE | Admit: 2019-07-05 | Discharge: 2019-07-05 | Disposition: A | Discharge status: Home / Self Care | Payer: PPO | Source: Ambulatory Visit | Attending: Internal Medicine | Admitting: Internal Medicine

## 2019-07-05 DIAGNOSIS — Z13820 Encounter for screening for osteoporosis: Secondary | ICD-10-CM | POA: Diagnosis not present

## 2019-07-05 DIAGNOSIS — Z780 Asymptomatic menopausal state: Secondary | ICD-10-CM | POA: Diagnosis not present

## 2019-07-12 ENCOUNTER — Telehealth: Payer: Self-pay | Admitting: Internal Medicine

## 2019-07-12 NOTE — Telephone Encounter (Signed)
Natalie Parker [phone]  Arlyne called to say she is having left hip pan that radiates down her leg below the knee for the last 3 months, she first thought it might be because she was not doing her water exercising, but she has been back with that for the last 3 weeks and the pain is not getting any better. She did state that usually she has had pain and problems on the right side, this is first time it has been on left side.

## 2019-07-12 NOTE — Telephone Encounter (Signed)
OV tomorrow

## 2019-07-12 NOTE — Telephone Encounter (Signed)
Scheduled

## 2019-07-13 ENCOUNTER — Ambulatory Visit
Admission: RE | Admit: 2019-07-13 | Discharge: 2019-07-13 | Disposition: A | Discharge status: Home / Self Care | Payer: PPO | Source: Ambulatory Visit | Attending: Internal Medicine | Admitting: Internal Medicine

## 2019-07-13 ENCOUNTER — Encounter: Payer: Self-pay | Admitting: Internal Medicine

## 2019-07-13 ENCOUNTER — Other Ambulatory Visit: Payer: Self-pay

## 2019-07-13 ENCOUNTER — Ambulatory Visit (INDEPENDENT_AMBULATORY_CARE_PROVIDER_SITE_OTHER): Payer: PPO | Admitting: Internal Medicine

## 2019-07-13 VITALS — BP 130/80 | HR 88 | Temp 98.2°F | Ht 66.5 in | Wt 188.0 lb

## 2019-07-13 DIAGNOSIS — M25552 Pain in left hip: Secondary | ICD-10-CM

## 2019-07-13 MED ORDER — PREDNISONE 10 MG PO TABS: ORAL_TABLET | ORAL | 0 refills | Status: DC

## 2019-07-20 MED ORDER — PREDNISONE 10 MG PO TABS: ORAL_TABLET | ORAL | 0 refills | Status: DC

## 2019-07-20 NOTE — Telephone Encounter (Addendum)
Patient agrees to PT, which doctor do I need to make this referral to?

## 2019-07-20 NOTE — Addendum Note (Signed)
Addended by: Mady Haagensen on: 07/20/2019 02:21 PM   Modules accepted: Orders

## 2019-07-20 NOTE — Telephone Encounter (Signed)
Referral entered  

## 2019-07-20 NOTE — Telephone Encounter (Signed)
Natalie Parker called back to say that she can not tell any difference since taking prednisone.

## 2019-07-20 NOTE — Telephone Encounter (Signed)
Then needs to go to PT and we can repeat the Prednisone taper. If does not respond to PT in 3 weeks, then we can try to get MRI approved. Is she willing to go to PT

## 2019-07-20 NOTE — Telephone Encounter (Signed)
Order another prednisone taper 10 mg #21. Try [facility]. Mariann Laster may have ifo

## 2019-07-23 NOTE — Progress Notes (Signed)
   Subjective:    Patient ID: Natalie Parker, female    DOB: [birth year], 68 y.o.   MRN: [id number]  HPI 68 year old Female with history of lumbar back pain status post surgery by Dr. Vertell Limber in 2015.  MRI showed bulging disc and neural foraminal involvement at L2-S1.  Patient is experiencing pain in the left hip.  She has seen Dr. Hardin Negus for chronic musculoskeletal pain management for some time and takes hydrocodone/APAP twice daily and then sometimes takes Flexeril  History of osteoarthritis of right knee seen at orthopedist.  Patient in today complaining of left hip pain without radiation into leg.  Cannot seem to get it under control despite being on chronic pain medication.  Denies any injury.  Has not had any recent x-ray of hip.  Review of Systems left lower extremity has not given away and she has not fallen     Objective:   Physical Exam She has some pain with internal and external rotation of her left hip.  Muscle strength in the left lower extremity is normal.  Able to dorsiflex left foot.  X-ray of the left hip shows no evidence of arthropathy or focal bone abnormality.       Assessment & Plan:  Since hip shows no significant osteoarthritis, I am assuming she has a left lumbar radiculopathy.  I am starting her on prednisone 10 mg going from 60 mg to 0 mg over 7 days.  Hopefully will get pain relief and if not we need to consider further evaluation and/or physical therapy

## 2019-07-23 NOTE — Patient Instructions (Addendum)
Take prednisone in tapering course going from 60 mg to 0 mg over 7 days and follow-up if not improving.  Continue narcotic pain medication as prescribed by Dr. Hardin Negus.  Have x-ray of left hip.  May need physical therapy if not improving.

## 2019-08-13 ENCOUNTER — Other Ambulatory Visit: Payer: Self-pay

## 2019-08-13 ENCOUNTER — Encounter: Payer: Self-pay | Admitting: Physical Therapy

## 2019-08-13 ENCOUNTER — Ambulatory Visit: Payer: PPO | Attending: Internal Medicine | Admitting: Physical Therapy

## 2019-08-13 DIAGNOSIS — M6281 Muscle weakness (generalized): Secondary | ICD-10-CM | POA: Insufficient documentation

## 2019-08-13 DIAGNOSIS — M25652 Stiffness of left hip, not elsewhere classified: Secondary | ICD-10-CM

## 2019-08-13 DIAGNOSIS — R2689 Other abnormalities of gait and mobility: Secondary | ICD-10-CM | POA: Insufficient documentation

## 2019-08-13 NOTE — Patient Instructions (Addendum)
Access Code: [email or web address]  URL: [email or web address]  Date: 08/13/2019  Prepared by: Mady Haagensen   Exercises  Supine Lower Trunk Rotation - 10 reps - 1-2 sets - 10-15 sec hold - 1x daily - 5x weekly  Hooklying Single Knee to Chest - 3 reps - 1 sets - 15-30 sec hold - 1x daily - 5x weekly, initial rocking, then into gentle stretch Supine Pelvic Tilt - 5-10 reps - 2 sets - 1x daily - 5x weekly

## 2019-08-13 NOTE — Therapy (Signed)
[facility] [address] [place], [place], [address] Phone: [phone]   Fax:  [phone]  Physical Therapy Evaluation  Patient Details  Name: Natalie Parker MRN: [id number] Date of Birth: 07/15/1951 Referring Provider (PT): Elby Showers   Encounter Date: 08/13/2019  CLINIC OPERATION CHANGES: Outpatient [facility] is open at lower capacity following universal masking, social distancing, and patient screening.  The patient's COVID risk of complications score is 1.    PT End of Session - 08/13/19 1903    Visit Number  1    Number of Visits  9    Date for PT Re-Evaluation  10/12/19    Authorization Type  [employer]-will need 10th visit progress note    PT Start Time  0933    PT Stop Time  1019    PT Time Calculation (min)  46 min    Activity Tolerance  Patient tolerated treatment well    Behavior During Therapy  WFL for tasks assessed/performed       Past Medical History:  Diagnosis Date  . Depression   . Osteopenia   . Spinal stenosis     Past Surgical History:  Procedure Laterality Date  . repair torn meniscus    . TONSILLECTOMY      There were no vitals filed for this visit.   Subjective Assessment - 08/13/19 0938    Subjective  Pt reports L hip pain, with x-ray negative in hip, that the pain maybe coming from the back.  I'm in the water again for water aerobics.  Have history of back pain/nerve pain.  Certain ways that I move make it seem like it could be nerve pain.    Limitations  Sitting    How long can you sit comfortably?  Don't sit a long time because sitting is not comfortable.    How long can you stand comfortably?  Depends    Diagnostic tests  Hip x-ray is negative    Currently in Pain?  Yes    Pain Score  5     Pain Location  Hip    Pain Orientation  Left    Pain Descriptors / Indicators  Burning    Pain Type  Chronic pain   6 months   Pain Radiating Towards   anterior/lateral aspect of L leg    Pain Onset  More than a month ago    Pain Frequency  Intermittent    Aggravating Factors   Worse in sitting, with changes of positions sleeping    Pain Relieving Factors  lying down, heat, ice, medication    Effect of Pain on Daily Activities  PT will attempt to address through exercises, stretches, modalities as needed         [facility] PT Assessment - 08/13/19 0945      Assessment   Medical Diagnosis  L Hip joint pain    Referring Provider (PT)  Elby Showers    Onset Date/Surgical Date  07/13/19   MD visit     Precautions   Precautions  None      Balance Screen   Has the patient fallen in the past 6 months  No    Has the patient had a decrease in activity level because of a fear of falling?   No      Home Environment   Living Environment  Private residence    Living Arrangements  Spouse/significant other    Available Help at Discharge  Family    Type of [place] to enter    Entrance Stairs-Number of Steps  2    Entrance Stairs-Rails  None    Home Layout  Multi-level;Laundry or work area in [employer] in basement, steps to enter home from basement   Alternate Level Stairs-Number of Steps  14    Alternate Level Stairs-Rails  [employer]  None      Prior Function   Level of Independence  Independent with basic ADLs    Vocation  Retired    Biomedical scientist  Retired hairdresser    Leisure  Goes to [employer] and does [employer], about 6 hrs/wk      Observation/Other Assessments   Focus on Therapeutic Outcomes (FOTO)   NA      Sensation   Light Touch  Appears Intact      Posture/Postural Control   Posture Comments  Gross assessment of leg length in supine, no apparent leg length difference in supine      ROM / Strength   AROM / PROM / Strength  Strength;PROM;AROM      AROM   Overall AROM   Due to pain;Deficits    Overall AROM Comments  L hip flexion, abduction limited  due to pain.      PROM   Overall PROM   Deficits    Overall PROM Comments  Hamstring ROM measured from 90/90 position:  L -20 degrees; R - 25 degrees.  Pt guarded with P/ROM LLE>RLE.        Strength   Overall Strength Comments  Grossly tested 4/5 LLE, 5/5 RLE, pain with resisted hip flexion and knee extension in sitting;in sidelying:  L hip abduction 3-/5      Palpation   Palpation comment  Palpation along L iliotibial (IT) band, with pt tender to palpation along IT band, especially proximal and distal aspects      Special Tests   Other special tests  Initial standing position back pain rated as 0/10; Repeated flexion x 5 yields 2/10 back pain; repeated extension x 5 yields 2/10 low back pain (does not reproduce L hip pain.)      Transfers   Transfers  Sit to Stand;Stand to Sit    Sit to Stand  6: Modified independent (Device/Increase time)    Stand to Sit  6: Modified independent (Device/Increase time)      Ambulation/Gait   Ambulation/Gait  Yes    Ambulation/Gait Assistance  6: Modified independent (Device/Increase time)   slowed pace, no device   Ambulation Distance (Feet)  60 Feet    Assistive device  None    Gait Pattern  Step-through pattern;Trendelenburg;Antalgic    Ambulation Surface  Level;Indoor                Objective measurements completed on examination: See above findings.           Therapeutic Exercise Access Code: [email or web address]  URL: [email or web address]  Date: 08/13/2019  Prepared by: Mady Haagensen   Exercises  Supine Lower Trunk Rotation - 10 reps - 1-2 sets - 10-15 sec hold - 1x daily - 5x weekly  Hooklying Single Knee to Chest - 3 reps - 1 sets - 15-30 sec hold - 1x daily - 5x weekly, initial rocking, then into gentle stretch Supine Pelvic Tilt - 5-10 reps - 2 sets - 1x daily - 5x weekly    Pt tends  to bring hips into bilateral flexion with double knee to chest, then into more extreme trunk rotation as part of her current HEP.   Advised patient to try single knee to chest and more gentle supine lower trunk rotation with feet on bed.    PT Education - 08/13/19 1900    Education Details  Educated in HEP to address gentle low back/hip stretching and flexibility (to avoid stretching into/beyond point of pain)    Person(s) Educated  Patient    Methods  Explanation;Demonstration;Verbal cues;Handout    Comprehension  Verbalized understanding;Returned demonstration          PT Long Term Goals - 08/13/19 1921      PT LONG TERM GOAL #1   Title  Pt will be independent with HEP to address pain, flexibility, strength.  TARGET 09/14/2019 (may be delayed due to late start-scheduling conflicts)    Time  5    Period  Weeks    Status  New    Target Date  09/14/19      PT LONG TERM GOAL #2   Title  Pt will verbalize at least 3 means to decrease pain in L hip.    Time  5    Period  Weeks    Status  New    Target Date  09/14/19      PT LONG TERM GOAL #3   Title  Pt will perform standing and sitting activities > 10 minutes, with less than 2 point increase in pain in L hip, for improved ADLs.    Time  5    Period  Weeks    Status  New    Target Date  09/14/19      PT LONG TERM GOAL #4   Title  Pt will improve L hip abduction strength to at least 3+/5 to improve hip stability and decrease Trendelenburg/antalgic gait pattern.    Time  5    Period  Weeks    Status  New    Target Date  09/14/19             Plan - 08/13/19 1906    Clinical Impression Statement  Pt is a 68 year old female who presents to OPPT with dx of L hip pain, no arthritis noted on x-ray.  Pt reports she feels this could be nerve related from her back, as she has had this type of pain in the past prior to previous lumbar surgery in 2015; however, pattern of pain is along lateral L thigh and into lateral calf; it does not present as sciatica.  With repeated back flexion and extension, L hip pain did not get reproduced.  L hip pain reproduced  with hip flexion, abd/adduction motions.  L hip pain is limiting flexibility, including L hip flexion and abduction activities for bed mobility and lifting LLE into bed or car; pt also presents with decreased hip strength, antalgic gait pattern.  Pt is active with water aerobics, but pain is a limiting factor with standing, sitting activities on land.  Pt will benefit from skilled PT to address pain/above deficits to improve functional mobility and quality of life.    Personal Factors and Comorbidities  Comorbidity 3+    Comorbidities  PMH:  lumbar pain s/p surgery 2015, disc bulge L2-S1 MRI, OA    Examination-Activity Limitations  Sit;Sleep;Stand;Locomotion Level    Examination-Participation Restrictions  Community Activity    Stability/Clinical Decision Making  Evolving/Moderate complexity    Clinical Decision Making  Moderate    Rehab Potential  Good    PT Frequency  2x / week    PT Duration  4 weeks   plus eval, total POC = 5 weeks   PT Treatment/Interventions  ADLs/Self Care Home Management;Cryotherapy;Gait training;DME Instruction;Ultrasound;Traction;Moist Heat;Iontophoresis 4mg /ml Dexamethasone;Functional mobility training;Therapeutic activities;Therapeutic exercise;Patient/family education;Manual techniques;Passive range of motion    PT Next Visit Plan  Check hip internal/external rotation measures, abduction ROM measures; review stretches; work on IT band stretching/massage/ice massage if needed;hip ROM and strengthening; modalities as needed    Consulted and Agree with Plan of Care  Patient       Patient will benefit from skilled therapeutic intervention in order to improve the following deficits and impairments:  Abnormal gait, Decreased range of motion, Decreased mobility, Decreased strength, Impaired flexibility, Difficulty walking  Visit Diagnosis: Stiffness of left hip, not elsewhere classified  Muscle weakness (generalized)  Other abnormalities of gait and  mobility     Problem List Patient Active Problem List   Diagnosis Date Noted  . Depression 10/10/2012  . Insomnia 10/10/2012  . Bilateral hip pain 03/29/2012  . Spinal stenosis 09/29/2011  . Hot flashes 09/29/2011  . History of smoking 09/29/2011  . Osteopenia 09/29/2011  . Low back pain 09/27/2011    Lene Mckay W. 08/13/2019, 7:33 PM  Frazier Butt., PT   [facility] [address] [place], [place], [address] Phone: [phone]   Fax:  [phone]  Name: Natalie Parker MRN: [id number] Date of Birth: [birth year]

## 2019-08-22 DIAGNOSIS — H5213 Myopia, bilateral: Secondary | ICD-10-CM | POA: Diagnosis not present

## 2019-08-22 DIAGNOSIS — H52223 Regular astigmatism, bilateral: Secondary | ICD-10-CM | POA: Diagnosis not present

## 2019-08-22 DIAGNOSIS — H524 Presbyopia: Secondary | ICD-10-CM | POA: Diagnosis not present

## 2019-08-24 DIAGNOSIS — M5416 Radiculopathy, lumbar region: Secondary | ICD-10-CM | POA: Diagnosis not present

## 2019-08-24 DIAGNOSIS — M150 Primary generalized (osteo)arthritis: Secondary | ICD-10-CM | POA: Diagnosis not present

## 2019-08-24 DIAGNOSIS — G894 Chronic pain syndrome: Secondary | ICD-10-CM | POA: Diagnosis not present

## 2019-08-24 DIAGNOSIS — M47816 Spondylosis without myelopathy or radiculopathy, lumbar region: Secondary | ICD-10-CM | POA: Diagnosis not present

## 2019-08-30 ENCOUNTER — Ambulatory Visit: Payer: PPO | Attending: Internal Medicine | Admitting: Physical Therapy

## 2019-08-30 ENCOUNTER — Encounter: Payer: Self-pay | Admitting: Physical Therapy

## 2019-08-30 ENCOUNTER — Other Ambulatory Visit: Payer: Self-pay

## 2019-08-30 DIAGNOSIS — R2689 Other abnormalities of gait and mobility: Secondary | ICD-10-CM | POA: Diagnosis not present

## 2019-08-30 DIAGNOSIS — M25652 Stiffness of left hip, not elsewhere classified: Secondary | ICD-10-CM | POA: Diagnosis not present

## 2019-08-30 DIAGNOSIS — M6281 Muscle weakness (generalized): Secondary | ICD-10-CM

## 2019-08-31 ENCOUNTER — Ambulatory Visit: Payer: PPO | Admitting: Physical Therapy

## 2019-08-31 ENCOUNTER — Encounter: Payer: Self-pay | Admitting: Physical Therapy

## 2019-08-31 DIAGNOSIS — M25652 Stiffness of left hip, not elsewhere classified: Secondary | ICD-10-CM

## 2019-08-31 DIAGNOSIS — M6281 Muscle weakness (generalized): Secondary | ICD-10-CM

## 2019-08-31 DIAGNOSIS — R2689 Other abnormalities of gait and mobility: Secondary | ICD-10-CM

## 2019-08-31 NOTE — Patient Instructions (Signed)
Access Code: [email or web address]  URL: [email or web address]  Date: 08/31/2019  Prepared by: Willow Ora   Exercises Standing ITB Stretch - 3 reps - 1 sets - 30 hold - 1x daily - 5x weekly

## 2019-08-31 NOTE — Therapy (Signed)
[facility] [address] [place], [place], [address] Phone: [phone]   Fax:  [phone]  Physical Therapy Treatment  Patient Details  Name: Natalie Parker MRN: [id number] Date of Birth: 1951/04/23 Referring Provider (PT): Elby Showers   Encounter Date: 08/30/2019  PT End of Session - 08/30/19 1237    Visit Number  2    Number of Visits  9    Date for PT Re-Evaluation  10/12/19    Authorization Type  [employer]-will need 10th visit progress note    PT Start Time  1233    PT Stop Time  1315    PT Time Calculation (min)  42 min    Activity Tolerance  Patient tolerated treatment well;No increased pain    Behavior During Therapy  WFL for tasks assessed/performed       Past Medical History:  Diagnosis Date  . Depression   . Osteopenia   . Spinal stenosis     Past Surgical History:  Procedure Laterality Date  . repair torn meniscus    . TONSILLECTOMY      There were no vitals filed for this visit.  Subjective Assessment - 08/30/19 1234    Subjective  No falls. Pain is about the same. Does not feel the ex's are helping or making worse.    Limitations  Sitting    How long can you sit comfortably?  Don't sit a long time because sitting is not comfortable.    How long can you stand comfortably?  Depends    Diagnostic tests  Hip x-ray is negative    Currently in Pain?  Yes    Pain Score  5     Pain Location  Hip    Pain Orientation  Left    Pain Descriptors / Indicators  Burning    Pain Type  Chronic pain    Pain Radiating Towards  down left LE    Pain Onset  More than a month ago    Pain Frequency  Intermittent    Aggravating Factors   worse with sitting, and with changing positions in sleeping    Pain Relieving Factors  lying down, heat, ice, medication            [facility] Adult PT Treatment/Exercise - 08/30/19 1630      Exercises   Exercises  Other Exercises    Other Exercises   in  right sidelying: clamshells no resistance, then hip abduction for 10 reps with cues on form and technique; in hoolying- SKTC with hip cramping therefore did not continue with this one. in quadruped- childs pose for 3 sets of 30 sec's with cat/cow for 10 reps between reps of childs pose, cues on form and hold times; standing IT band stretch at table. Discussed doing this one at home and in pool. will issue handout next session.       Moist Heat Therapy   Moist Heat Location  Hip   concurrent with hip distraction in sidelying     Manual Therapy   Manual therapy comments  all manual therapy performed for mobility, decreased pain, and decreased tightness.       Joint Mobilization  in right sidelying: hip joint mobs into inferior direction for mobility and QL stretching    Soft tissue mobilization  along IT band for decreased tightness    Myofascial Release  along IT band for decreased tightness- began manually progressing to use of foam roll  Manual Traction  with sheet for manual lumbar traction for 30-45 sec' holds for 6 reps; then in hooklying left LE hip distraction for 48 sec's for 5 reps. decreased pain reported after both.             PT Long Term Goals - 08/13/19 1921      PT LONG TERM GOAL #1   Title  Pt will be independent with HEP to address pain, flexibility, strength.  TARGET 09/14/2019 (may be delayed due to late start-scheduling conflicts)    Time  5    Period  Weeks    Status  New    Target Date  09/14/19      PT LONG TERM GOAL #2   Title  Pt will verbalize at least 3 means to decrease pain in L hip.    Time  5    Period  Weeks    Status  New    Target Date  09/14/19      PT LONG TERM GOAL #3   Title  Pt will perform standing and sitting activities > 10 minutes, with less than 2 point increase in pain in L hip, for improved ADLs.    Time  5    Period  Weeks    Status  New    Target Date  09/14/19      PT LONG TERM GOAL #4   Title  Pt will improve L hip  abduction strength to at least 3+/5 to improve hip stability and decrease Trendelenburg/antalgic gait pattern.    Time  5    Period  Weeks    Status  New    Target Date  09/14/19            Plan - 08/30/19 1238    Clinical Impression Statement  Today's skilled session focused on decreased muscle tightness/pain and gentle stretching with decreased pain reported after session. The pt is progressing toward goals and should benefit from continued PT to progress toward unmet goals.    Personal Factors and Comorbidities  Comorbidity 3+    Comorbidities  PMH:  lumbar pain s/p surgery 2015, disc bulge L2-S1 MRI, OA    Examination-Activity Limitations  Sit;Sleep;Stand;Locomotion Level    Examination-Participation Restrictions  Community Activity    Stability/Clinical Decision Making  Evolving/Moderate complexity    Rehab Potential  Good    PT Frequency  2x / week    PT Duration  4 weeks   plus eval, total POC = 5 weeks   PT Treatment/Interventions  ADLs/Self Care Home Management;Cryotherapy;Gait training;DME Instruction;Ultrasound;Traction;Moist Heat;Iontophoresis 4mg /ml Dexamethasone;Functional mobility training;Therapeutic activities;Therapeutic exercise;Patient/family education;Manual techniques;Passive range of motion    PT Next Visit Plan  continue to work on decreased pain/muscle tightness, core/hip strengthening and modalities as needed    PT Home Exercise Plan  Access Code: [email or web address]    Consulted and Agree with Plan of Care  Patient       Patient will benefit from skilled therapeutic intervention in order to improve the following deficits and impairments:  Abnormal gait, Decreased range of motion, Decreased mobility, Decreased strength, Impaired flexibility, Difficulty walking  Visit Diagnosis: Stiffness of left hip, not elsewhere classified  Muscle weakness (generalized)  Other abnormalities of gait and mobility     Problem List Patient Active Problem List   Diagnosis Date  Noted  . Depression 10/10/2012  . Insomnia 10/10/2012  . Bilateral hip pain 03/29/2012  . Spinal stenosis 09/29/2011  . Hot flashes 09/29/2011  . History of smoking  09/29/2011  . Osteopenia 09/29/2011  . Low back pain 09/27/2011    Willow Ora, PTA, [facility]patient Neuro [facility] [address], [place], [place] [address] [phone] 08/31/19, 9:16 AM   Name: Natalie Parker MRN: [id number] Date of Birth: January 20, 1951

## 2019-09-02 NOTE — Therapy (Signed)
[facility] [address] [place], [place], [address] Phone: [phone]   Fax:  [phone]  Physical Therapy Treatment  Patient Details  Name: Natalie Parker MRN: [id number] Date of Birth: 22-Feb-1951 Referring Provider (PT): Elby Showers   Encounter Date: 08/31/2019     08/31/19 1108  PT Visits / Re-Eval  Visit Number 3  Number of Visits 9  Date for PT Re-Evaluation 10/12/19  Authorization  Authorization Type [employer]-will need 10th visit progress note  PT Time Calculation  PT Start Time 1104  PT Stop Time 1146  PT Time Calculation (min) 42 min  PT - End of Session  Activity Tolerance Patient tolerated treatment well;No increased pain  Behavior During Therapy WFL for tasks assessed/performed    Past Medical History:  Diagnosis Date  . Depression   . Osteopenia   . Spinal stenosis     Past Surgical History:  Procedure Laterality Date  . repair torn meniscus    . TONSILLECTOMY      There were no vitals filed for this visit.     08/31/19 1105  Symptoms/Limitations  Subjective No changes since yesterday. Did try sleeping with pillow between legs like discussed yesterday with less discomfort noted with sleeping.  Limitations Sitting  How long can you stand comfortably? Depends  Pain Assessment  Currently in Pain? Yes  Pain Score 5  Pain Location Hip  Pain Orientation Left  Pain Descriptors / Indicators Radiating;Burning  Pain Type Chronic pain  Pain Radiating Towards down left LE  Pain Onset More than a month ago  Pain Frequency Intermittent  Aggravating Factors  worse with sitting adn with changing position in sleeping  Pain Relieving Factors lying down, heat, ice, medication      08/31/19 1108  Exercises  Exercises Other Exercises  Other Exercises  seated on large pball: pelvic rocking laterally, then anterior/posterior, pelvic circles both ways, UE raise laterally with  contralateral hip elevation for 10 reps each side, then cross body reaching with contralateral hip roll out on ball for 10 reps each side. cues on form and technique.   Modalities  Modalities Electrical Stimulation  Moist Heat Therapy  Moist Heat Location Hip  Number Minutes Moist Heat 10 Minutes  Electrical Stimulation  Electrical Stimulation Location left hip/lower side of back  Electrical Stimulation Action IFC for decreased pain and muscle tightness concurrent with moist hot pack  Electrical Stimulation Parameters intensity to tolerance  Electrical Stimulation Goals Pain  Manual Therapy  Manual therapy comments all manual therapy performed for mobility, decreased pain, and decreased tightness.     Manual Traction manual lumbar traction with sheet for prolonged holds for multiple reps with decreased pain/tightness; left LE hip distraction for decreased pain/muscle tightness with relief reported during hold times      PT Long Term Goals - 08/13/19 1921      PT LONG TERM GOAL #1   Title  Pt will be independent with HEP to address pain, flexibility, strength.  TARGET 09/14/2019 (may be delayed due to late start-scheduling conflicts)    Time  5    Period  Weeks    Status  New    Target Date  09/14/19      PT LONG TERM GOAL #2   Title  Pt will verbalize at least 3 means to decrease pain in L hip.    Time  5    Period  Weeks    Status  New  Target Date  09/14/19      PT LONG TERM GOAL #3   Title  Pt will perform standing and sitting activities > 10 minutes, with less than 2 point increase in pain in L hip, for improved ADLs.    Time  5    Period  Weeks    Status  New    Target Date  09/14/19      PT LONG TERM GOAL #4   Title  Pt will improve L hip abduction strength to at least 3+/5 to improve hip stability and decrease Trendelenburg/antalgic gait pattern.    Time  5    Period  Weeks    Status  New    Target Date  09/14/19         08/31/19 1108  Plan  Clinical  Impression Statement Today's skilled session continued to address pain and muscle tightness with decreased pain reported after session, 3-4/10 after session. Did also obtain left hip measurements with IR 35*, ER 25* AROM. The pt is progressing toward goals and should benefit from continued PT to progress toward unmet goals.  Personal Factors and Comorbidities Comorbidity 3+  Comorbidities PMH:  lumbar pain s/p surgery 2015, disc bulge L2-S1 MRI, OA  Examination-Activity Limitations Sit;Sleep;Stand;Locomotion Level  Examination-Participation Restrictions Community Activity  Pt will benefit from skilled therapeutic intervention in order to improve on the following deficits Abnormal gait;Decreased range of motion;Decreased mobility;Decreased strength;Impaired flexibility;Difficulty walking  Stability/Clinical Decision Making Evolving/Moderate complexity  Rehab Potential Good  PT Frequency 2x / week  PT Duration 4 weeks (plus eval, total POC = 5 weeks)  PT Treatment/Interventions ADLs/Self Care Home Management;Cryotherapy;Gait training;DME Instruction;Ultrasound;Traction;Moist Heat;Iontophoresis 4mg /ml Dexamethasone;Functional mobility training;Therapeutic activities;Therapeutic exercise;Patient/family education;Manual techniques;Passive range of motion  PT Next Visit Plan continue to work on decreased pain/muscle tightness, core/hip strengthening and modalities as needed  PT Home Exercise Plan Access Code: [email or web address]  Consulted and Agree with Plan of Care Patient          Patient will benefit from skilled therapeutic intervention in order to improve the following deficits and impairments:  Abnormal gait, Decreased range of motion, Decreased mobility, Decreased strength, Impaired flexibility, Difficulty walking  Visit Diagnosis: Stiffness of left hip, not elsewhere classified  Muscle weakness (generalized)  Other abnormalities of gait and mobility     Problem List Patient Active Problem  List   Diagnosis Date Noted  . Depression 10/10/2012  . Insomnia 10/10/2012  . Bilateral hip pain 03/29/2012  . Spinal stenosis 09/29/2011  . Hot flashes 09/29/2011  . History of smoking 09/29/2011  . Osteopenia 09/29/2011  . Low back pain 09/27/2011    Willow Ora, PTA, [facility] Outpatient Neuro [facility] [address] [place], [place] [address] [phone] 09/02/19, 10:54 PM   Name: TIMBERLEE CRICK MRN: [id number] Date of Birth: [birth year]

## 2019-09-07 ENCOUNTER — Ambulatory Visit: Payer: PPO | Admitting: Physical Therapy

## 2019-09-07 ENCOUNTER — Other Ambulatory Visit: Payer: Self-pay

## 2019-09-07 DIAGNOSIS — R2689 Other abnormalities of gait and mobility: Secondary | ICD-10-CM

## 2019-09-07 DIAGNOSIS — M25652 Stiffness of left hip, not elsewhere classified: Secondary | ICD-10-CM | POA: Diagnosis not present

## 2019-09-07 DIAGNOSIS — M6281 Muscle weakness (generalized): Secondary | ICD-10-CM

## 2019-09-07 NOTE — Therapy (Signed)
[facility] [address] [place], [place], [address] Phone: [phone]   Fax:  [phone]  Physical Therapy Treatment  Patient Details  Name: Natalie Parker MRN: [id number] Date of Birth: [birth year] Referring Provider (PT): Elby Showers   Encounter Date: 09/07/2019  PT End of Session - 09/07/19 1544    Visit Number  4    Number of Visits  9    Date for PT Re-Evaluation  10/12/19    Authorization Type  [employer]-will need 10th visit progress note    PT Start Time  1106    PT Stop Time  1145    PT Time Calculation (min)  39 min    Activity Tolerance  Patient tolerated treatment well;No increased pain    Behavior During Therapy  WFL for tasks assessed/performed       Past Medical History:  Diagnosis Date  . Depression   . Osteopenia   . Spinal stenosis     Past Surgical History:  Procedure Laterality Date  . repair torn meniscus    . TONSILLECTOMY      There were no vitals filed for this visit.  Subjective Assessment - 09/07/19 1108    Subjective  Went on a trip and did too much this weekend.  "I used to resting more."    Limitations  Sitting    How long can you stand comfortably?  Depends    Currently in Pain?  Yes    Pain Score  3     Pain Location  Hip    Pain Orientation  Left    Pain Descriptors / Indicators  Burning    Pain Type  Chronic pain    Pain Onset  More than a month ago    Pain Frequency  Intermittent    Aggravating Factors   any one thing for too long    Pain Relieving Factors  changing positions          [facility] Adult PT Treatment/Exercise - 09/07/19 0001      Posture/Postural Control   Posture Comments  In standing pt appears slightly elevated on right iliac crest.  Also checked in supine and appears slightly shorter on the right.  Provided with 1/4 lift for pt to try in her right shoe and see if she notices a difference in gait.  Recommended for pt to only use fo 30  -60 minutes initially.      Exercises   Exercises  Other Exercises;Lumbar;Knee/Hip    Other Exercises   seated on large pball: pelvic rocking laterally, then anterior/posterior, pelvic anterior/posterior tilts, LAQ, hip elevation alternating sides.  Repeated all x 15 reps x 2 sets.   cues on form and technique.       Lumbar Exercises: Stretches   Single Knee to Chest Stretch  Right;Left;2 reps;30 seconds      Lumbar Exercises: Supine   Pelvic Tilt  10 reps;5 seconds      Knee/Hip Exercises: Supine   Hip Adduction Isometric  Both;1 set;10 reps    Other Supine Knee/Hip Exercises  Clamshells with red band x 10 x 2 sets then single leg x 10 x 2 sets      Manual Therapy   Manual therapy comments  all manual therapy performed for mobility, decreased pain, and decreased tightness.       Manual Traction  manual lumbar traction with sheet for prolonged holds for multiple reps with decreased pain/tightness; left LE hip distraction for  decreased pain/muscle tightness with relief reported during hold times             PT Education - 09/07/19 1546    Education Details  Lift for R shoe, limiting wearing to 30-60 minutes at a time initially to see if gait improves.    Person(s) Educated  Patient    Methods  Explanation    Comprehension  Verbalized understanding          PT Long Term Goals - 08/13/19 1921      PT LONG TERM GOAL #1   Title  Pt will be independent with HEP to address pain, flexibility, strength.  TARGET 09/14/2019 (may be delayed due to late start-scheduling conflicts)    Time  5    Period  Weeks    Status  New    Target Date  09/14/19      PT LONG TERM GOAL #2   Title  Pt will verbalize at least 3 means to decrease pain in L hip.    Time  5    Period  Weeks    Status  New    Target Date  09/14/19      PT LONG TERM GOAL #3   Title  Pt will perform standing and sitting activities > 10 minutes, with less than 2 point increase in pain in L hip, for improved ADLs.     Time  5    Period  Weeks    Status  New    Target Date  09/14/19      PT LONG TERM GOAL #4   Title  Pt will improve L hip abduction strength to at least 3+/5 to improve hip stability and decrease Trendelenburg/antalgic gait pattern.    Time  5    Period  Weeks    Status  New    Target Date  09/14/19            Plan - 09/07/19 1545    Clinical Impression Statement  Skilled session focused on exercises, stretches and manual therapy for pain and tightness.  Pt reports pain decreased to 1-2/10 after session.  Continue PT per POC.    Personal Factors and Comorbidities  Comorbidity 3+    Comorbidities  PMH:  lumbar pain s/p surgery 2015, disc bulge L2-S1 MRI, OA    Examination-Activity Limitations  Sit;Sleep;Stand;Locomotion Level    Examination-Participation Restrictions  Community Activity    Stability/Clinical Decision Making  Evolving/Moderate complexity    Rehab Potential  Good    PT Frequency  2x / week    PT Duration  4 weeks   plus eval, total POC = 5 weeks   PT Treatment/Interventions  ADLs/Self Care Home Management;Cryotherapy;Gait training;DME Instruction;Ultrasound;Traction;Moist Heat;Iontophoresis 4mg /ml Dexamethasone;Functional mobility training;Therapeutic activities;Therapeutic exercise;Patient/family education;Manual techniques;Passive range of motion    PT Next Visit Plan  continue to work on decreased pain/muscle tightness, core/hip strengthening and modalities as needed    PT Home Exercise Plan  Access Code: [email or web address]    Consulted and Agree with Plan of Care  Patient       Patient will benefit from skilled therapeutic intervention in order to improve the following deficits and impairments:  Abnormal gait, Decreased range of motion, Decreased mobility, Decreased strength, Impaired flexibility, Difficulty walking  Visit Diagnosis: Stiffness of left hip, not elsewhere classified  Muscle weakness (generalized)  Other abnormalities of gait and  mobility     Problem List Patient Active Problem List   Diagnosis Date Noted  . Depression  10/10/2012  . Insomnia 10/10/2012  . Bilateral hip pain 03/29/2012  . Spinal stenosis 09/29/2011  . Hot flashes 09/29/2011  . History of smoking 09/29/2011  . Osteopenia 09/29/2011  . Low back pain 09/27/2011    Narda Bonds, [place] 09/07/19 3:48 PM Phone: [phone] Fax: [facility] [address] [place], [place], [address] Phone: [phone]   Fax:  [phone]  Name: Natalie Parker MRN: [id number] Date of Birth: 20-May-1951

## 2019-09-11 ENCOUNTER — Other Ambulatory Visit: Payer: Self-pay

## 2019-09-11 ENCOUNTER — Encounter: Payer: Self-pay | Admitting: Physical Therapy

## 2019-09-11 ENCOUNTER — Ambulatory Visit: Payer: PPO | Admitting: Physical Therapy

## 2019-09-11 DIAGNOSIS — M25652 Stiffness of left hip, not elsewhere classified: Secondary | ICD-10-CM | POA: Diagnosis not present

## 2019-09-11 DIAGNOSIS — M6281 Muscle weakness (generalized): Secondary | ICD-10-CM

## 2019-09-11 DIAGNOSIS — R2689 Other abnormalities of gait and mobility: Secondary | ICD-10-CM

## 2019-09-12 ENCOUNTER — Ambulatory Visit (INDEPENDENT_AMBULATORY_CARE_PROVIDER_SITE_OTHER): Payer: PPO | Admitting: Internal Medicine

## 2019-09-12 ENCOUNTER — Other Ambulatory Visit: Payer: Self-pay

## 2019-09-12 ENCOUNTER — Encounter: Payer: Self-pay | Admitting: Internal Medicine

## 2019-09-12 DIAGNOSIS — Z23 Encounter for immunization: Secondary | ICD-10-CM | POA: Diagnosis not present

## 2019-09-12 NOTE — Therapy (Signed)
[facility] [address] [place], [place], [address] Phone: [phone]   Fax:  [phone]  Physical Therapy Treatment  Patient Details  Name: Natalie Parker MRN: [id number] Date of Birth: 02-11-1951 Referring Provider (PT): Elby Showers   Encounter Date: 09/11/2019  PT End of Session - 09/12/19 1346    Visit Number  5    Number of Visits  9    Date for PT Re-Evaluation  10/12/19    Authorization Type  [employer]-will need 10th visit progress note    PT Start Time  1105    PT Stop Time  1146    PT Time Calculation (min)  41 min    Activity Tolerance  Patient tolerated treatment well;No increased pain    Behavior During Therapy  WFL for tasks assessed/performed       Past Medical History:  Diagnosis Date  . Depression   . Osteopenia   . Spinal stenosis     Past Surgical History:  Procedure Laterality Date  . repair torn meniscus    . TONSILLECTOMY      There were no vitals filed for this visit.  Subjective Assessment - 09/11/19 1109    Subjective  Today's not a good day.  Pain is 4-5/10.  Tried to get back to water exercises yesterday and maybe I overdid it.    Limitations  Sitting    How long can you stand comfortably?  Depends    Currently in Pain?  Yes    Pain Score  5     Pain Location  Hip    Pain Orientation  Left    Pain Descriptors / Indicators  Burning    Pain Type  Chronic pain    Pain Onset  More than a month ago    Pain Frequency  Intermittent    Aggravating Factors   not sure, maybe overdid the water exerciess    Pain Relieving Factors  changing positioning, heating pad.                       [facility] Adult PT Treatment/Exercise - 09/12/19 1336      Ambulation/Gait   Ambulation/Gait  Yes    Ambulation/Gait Assistance  6: Modified independent (Device/Increase time)    Ambulation Distance (Feet)  120 Feet   60 ft x 2   Assistive device  None   wearing shoe  insert in R hoe   Gait Pattern  Step-through pattern;Trendelenburg;Antalgic   Less trunk lean on L with shoe insert on R   Stairs  Yes    Stairs Assistance  6: Modified independent (Device/Increase time)    Stair Management Technique  Two rails;Step to pattern;Forwards   Attempts step through pattern with L foot leading, unable   Number of Stairs  4    Height of Stairs  6    Gait Comments  PT trimmed the shoe heel wedge insert given to patient last visit (too wide for patient's shoe), and pt able to fit it better into shoe after trimming.        Exercises   Exercises  Other Exercises;Lumbar;Knee/Hip    Other Exercises   seated on large physioball: pelvic rocking laterally, then anterior/posterior, pelvic anterior/posterior tilts, LAQ, hip elevation alternating sides.  Repeated all x 10 reps x 2 sets.   cues on form and technique.       Lumbar Exercises: Stretches   Lower Trunk Rotation  3  reps;20 seconds   pain in L hip with trunk rotation to R   Lower Trunk Rotation Limitations  Initiated with rocking side to side    Other Lumbar Stretch Exercise  Trunk rotation stretch pushing LLE into extension, LUE reaching up and across, 5-10 second hold x 3 reps; then repeated to other side.      Lumbar Exercises: Supine   Pelvic Tilt  10 reps;5 seconds    Other Supine Lumbar Exercises  Hooklying marching x 10 reps; legs over red therapy ball:  hip/knee flexion x 10 reps, then trunk rotation, 10 reps each side, then bridging with legs over ball, x 10 reps.      Manual Therapy   Manual therapy comments  all manual therapy performed for mobility, decreased pain, and decreased tightness.       Soft tissue mobilization  along IT band for decreased tightness    Myofascial Release  along IT band for decreased tightness- began manually progressing to use of foam roll   REviewed benefit of foam roll for home use        Reviewed wear time for shoe insert and to remove if painful.  Reviewed questions  about foam roller use for IT band tenderness.         PT Long Term Goals - 08/13/19 1921      PT LONG TERM GOAL #1   Title  Pt will be independent with HEP to address pain, flexibility, strength.  TARGET 09/14/2019 (may be delayed due to late start-scheduling conflicts)    Time  5    Period  Weeks    Status  New    Target Date  09/14/19      PT LONG TERM GOAL #2   Title  Pt will verbalize at least 3 means to decrease pain in L hip.    Time  5    Period  Weeks    Status  New    Target Date  09/14/19      PT LONG TERM GOAL #3   Title  Pt will perform standing and sitting activities > 10 minutes, with less than 2 point increase in pain in L hip, for improved ADLs.    Time  5    Period  Weeks    Status  New    Target Date  09/14/19      PT LONG TERM GOAL #4   Title  Pt will improve L hip abduction strength to at least 3+/5 to improve hip stability and decrease Trendelenburg/antalgic gait pattern.    Time  5    Period  Weeks    Status  New    Target Date  09/14/19        PLEASE NOTE, week of 10/13 is pt's week 3 of 4 in POC; goals to be checked week of 09/17/2019.    Plan - 09/12/19 1347    Clinical Impression Statement  Continued to focus on stretches, core stability to address hip pain/tightness.  No increase in pain reported at end of session; pt reports "better."  She is concerned about hip pain/discomfort and hip weakness when trying to ascend stairs (reported at end of session and will address next visit).    Personal Factors and Comorbidities  Comorbidity 3+    Comorbidities  PMH:  lumbar pain s/p surgery 2015, disc bulge L2-S1 MRI, OA    Examination-Activity Limitations  Sit;Sleep;Stand;Locomotion Level    Examination-Participation Restrictions  Community Activity    Stability/Clinical  Decision Making  Evolving/Moderate complexity    Rehab Potential  Good    PT Frequency  2x / week    PT Duration  4 weeks   plus eval, total POC = 5 weeks   PT  Treatment/Interventions  ADLs/Self Care Home Management;Cryotherapy;Gait training;DME Instruction;Ultrasound;Traction;Moist Heat;Iontophoresis 4mg /ml Dexamethasone;Functional mobility training;Therapeutic activities;Therapeutic exercise;Patient/family education;Manual techniques;Passive range of motion    PT Next Visit Plan  Try SLR stretch with sheet; step ups, hip stregnthening (start with 2" step ups/step taps);; continue to work on decreased pain/muscle tightness, core/hip strengthening and modalities as needed    PT Home Exercise Plan  Access Code: [email or web address]    Consulted and Agree with Plan of Care  Patient       Patient will benefit from skilled therapeutic intervention in order to improve the following deficits and impairments:  Abnormal gait, Decreased range of motion, Decreased mobility, Decreased strength, Impaired flexibility, Difficulty walking  Visit Diagnosis: Stiffness of left hip, not elsewhere classified  Muscle weakness (generalized)  Other abnormalities of gait and mobility     Problem List Patient Active Problem List   Diagnosis Date Noted  . Depression 10/10/2012  . Insomnia 10/10/2012  . Bilateral hip pain 03/29/2012  . Spinal stenosis 09/29/2011  . Hot flashes 09/29/2011  . History of smoking 09/29/2011  . Osteopenia 09/29/2011  . Low back pain 09/27/2011    MARRIOTT,AMY W. 09/12/2019, 1:51 PM Mady Haagensen, PT 09/12/19 1:53 PM Phone: [phone] Fax: [facility] [address] [place], [place], [address] Phone: [phone]   Fax:  [phone]  Name: Natalie Parker MRN: [id number] Date of Birth: 28-Jan-1951

## 2019-09-12 NOTE — Patient Instructions (Signed)
Patient received a flu vaccine IM L deltoid, AV, CMA  

## 2019-09-12 NOTE — Progress Notes (Signed)
Flu vaccine given by CMA 

## 2019-09-13 ENCOUNTER — Ambulatory Visit: Payer: PPO | Admitting: Physical Therapy

## 2019-09-13 DIAGNOSIS — M25652 Stiffness of left hip, not elsewhere classified: Secondary | ICD-10-CM

## 2019-09-13 DIAGNOSIS — R2689 Other abnormalities of gait and mobility: Secondary | ICD-10-CM

## 2019-09-13 DIAGNOSIS — M6281 Muscle weakness (generalized): Secondary | ICD-10-CM

## 2019-09-13 NOTE — Patient Instructions (Addendum)
Supine: Leg Stretch With Strap (Basic)    Lie on back with one knee bent, foot flat on floor. Hook strap (sheet) around other foot. Straighten knee and lift your leg until you feel a stretch in your buttocks. Keep knee level with other knee. Hold _15-30__ seconds. Relax leg completely down to floor.  Repeat _3__ times per session. Copyright  VHI. All rights reserved.    FUNCTIONAL MOBILITY: Marching/Step taps - Standing    March in place by lifting left leg up, then right. Alternate, tapping to the bottom step or to the cabinet shelf. _10__ reps per set, _1__ sets per day  Copyright  VHI. All rights reserved.  Alternating Step Up    Stand facing step. Step up leading with left leg. Step back down leading with same leg. Perform __10_ reps.  Copyright  VHI. All rights reserved.

## 2019-09-14 NOTE — Therapy (Signed)
[facility] [address] [place], [place], [address] Phone: [phone]   Fax:  [phone]  Physical Therapy Treatment  Patient Details  Name: Natalie Parker MRN: [id number] Date of Birth: Sep 05, 1951 Referring Provider (PT): Elby Showers   Encounter Date: 09/13/2019  PT End of Session - 09/14/19 1457    Visit Number  6    Number of Visits  9    Date for PT Re-Evaluation  10/12/19    Authorization Type  [employer]-will need 10th visit progress note    PT Start Time  1102    PT Stop Time  1144    PT Time Calculation (min)  42 min    Activity Tolerance  Patient tolerated treatment well;No increased pain    Behavior During Therapy  WFL for tasks assessed/performed       Past Medical History:  Diagnosis Date  . Depression   . Osteopenia   . Spinal stenosis     Past Surgical History:  Procedure Laterality Date  . repair torn meniscus    . TONSILLECTOMY      There were no vitals filed for this visit.  Subjective Assessment - 09/13/19 1103    Subjective  Today is better, did exercises in water yesterday again, and my pain is better.    Limitations  Sitting    How long can you stand comfortably?  Depends    Currently in Pain?  Yes    Pain Score  1     Pain Location  Hip    Pain Orientation  Left    Pain Descriptors / Indicators  Burning    Pain Type  Chronic pain    Pain Onset  More than a month ago    Pain Frequency  Intermittent    Aggravating Factors   not sure    Pain Relieving Factors  changing positions, heating                       [facility] Adult PT Treatment/Exercise - 09/14/19 1450      Ambulation/Gait   Stairs  Yes    Stairs Assistance  6: Modified independent (Device/Increase time)    Stair Management Technique  Two rails;Forwards;Alternating pattern    Number of Stairs  4   x 3 reps   Height of Stairs  6      Lumbar Exercises: Stretches   Passive Hamstring  Stretch  3 reps;20 seconds   SLR hamstring stretch, then pull across midline, each rep   Other Lumbar Stretch Exercise  Trunk rotation stretch pushing LLE into extension, LUE reaching up and across, 5-10 second hold x 3 reps      Lumbar Exercises: Supine   Other Supine Lumbar Exercises  In lumbar decompression position:  leg press (quad sets) x 5 reps each leg, then leg lengthener x 5 reps each leg (leg lengthener to replicate manual distraction)      Knee/Hip Exercises: Stretches   Other Knee/Hip Stretches  Supine heel digs (quad sets) x 5 reps each leg, then leg lengthening in supine, x 5 reps each leg.      Manual Therapy   Manual therapy comments  all manual therapy performed for mobility, decreased pain, and decreased tightness.       Manual Traction  Manual distraction provided at L hip, with leg extended, 5 reps x 45 seconds, with pt reporting improved comfort, decreased pain L hip  PT Education - 09/14/19 1456    Education Details  Additions to HEP-see instructions    Person(s) Educated  Patient    Methods  Explanation;Demonstration;Handout    Comprehension  Verbalized understanding;Returned demonstration          PT Long Term Goals - 08/13/19 1921      PT LONG TERM GOAL #1   Title  Pt will be independent with HEP to address pain, flexibility, strength.  TARGET 09/14/2019 (may be delayed due to late start-scheduling conflicts)    Time  5    Period  Weeks    Status  New    Target Date  09/14/19      PT LONG TERM GOAL #2   Title  Pt will verbalize at least 3 means to decrease pain in L hip.    Time  5    Period  Weeks    Status  New    Target Date  09/14/19      PT LONG TERM GOAL #3   Title  Pt will perform standing and sitting activities > 10 minutes, with less than 2 point increase in pain in L hip, for improved ADLs.    Time  5    Period  Weeks    Status  New    Target Date  09/14/19      PT LONG TERM GOAL #4   Title  Pt will improve L hip  abduction strength to at least 3+/5 to improve hip stability and decrease Trendelenburg/antalgic gait pattern.    Time  5    Period  Weeks    Status  New    Target Date  09/14/19            Plan - 09/14/19 1457    Clinical Impression Statement  Pt tolerates exercise well to address hip/knee flexion for step taps and step ups onto gradually higher steps, including progression to alternating step pattern with UE support.  Additional stretches, including exercise for self-distraction (leg lengthener) exercise, with pt performing well and having no increase in pain (still rates as 1/10) in L hip at end of session.    Personal Factors and Comorbidities  Comorbidity 3+    Comorbidities  PMH:  lumbar pain s/p surgery 2015, disc bulge L2-S1 MRI, OA    Examination-Activity Limitations  Sit;Sleep;Stand;Locomotion Level    Examination-Participation Restrictions  Community Activity    Stability/Clinical Decision Making  Evolving/Moderate complexity    Rehab Potential  Good    PT Frequency  2x / week    PT Duration  4 weeks   plus eval, total POC = 5 weeks   PT Treatment/Interventions  ADLs/Self Care Home Management;Cryotherapy;Gait training;DME Instruction;Ultrasound;Traction;Moist Heat;Iontophoresis 4mg /ml Dexamethasone;Functional mobility training;Therapeutic activities;Therapeutic exercise;Patient/family education;Manual techniques;Passive range of motion    PT Next Visit Plan  Review additions to HPE; continue to work on hip strenghtening and flexibility; modalities as needs, core stability (next week is last in POC)    PT Home Exercise Plan  Access Code: [email or web address]    Consulted and Agree with Plan of Care  Patient       Patient will benefit from skilled therapeutic intervention in order to improve the following deficits and impairments:  Abnormal gait, Decreased range of motion, Decreased mobility, Decreased strength, Impaired flexibility, Difficulty walking  Visit Diagnosis: Stiffness of  left hip, not elsewhere classified  Muscle weakness (generalized)  Other abnormalities of gait and mobility     Problem List Patient Active Problem List  Diagnosis Date Noted  . Depression 10/10/2012  . Insomnia 10/10/2012  . Bilateral hip pain 03/29/2012  . Spinal stenosis 09/29/2011  . Hot flashes 09/29/2011  . History of smoking 09/29/2011  . Osteopenia 09/29/2011  . Low back pain 09/27/2011    Antwan Bribiesca W. 09/14/2019, 3:00 PM Frazier Butt., PT  [facility] [address] [place], [place], [address] Phone: [phone]   Fax:  [phone]  Name: CERISE LENOIR MRN: [id number] Date of Birth: 03-07-1951

## 2019-09-17 ENCOUNTER — Ambulatory Visit: Payer: PPO | Admitting: Physical Therapy

## 2019-09-17 ENCOUNTER — Other Ambulatory Visit: Payer: Self-pay

## 2019-09-17 DIAGNOSIS — M6281 Muscle weakness (generalized): Secondary | ICD-10-CM

## 2019-09-17 DIAGNOSIS — M25652 Stiffness of left hip, not elsewhere classified: Secondary | ICD-10-CM

## 2019-09-17 DIAGNOSIS — R2689 Other abnormalities of gait and mobility: Secondary | ICD-10-CM

## 2019-09-17 NOTE — Patient Instructions (Addendum)
On Elbows (Prone)    Place a pillow under your pelvis.  Rise up on elbows as high as possible, keeping hips on floor. Hold __3-5__ seconds. Repeat __5__ times per set. Do ___1_ sets per session. Do _1__ sessions per day.  http://orth.exer.us/93   Copyright  VHI. All rights reserved.  You can also finish this exercises by rocking back into the stretch position on hands and knees.

## 2019-09-19 ENCOUNTER — Encounter: Payer: Self-pay | Admitting: Physical Therapy

## 2019-09-19 ENCOUNTER — Other Ambulatory Visit: Payer: Self-pay

## 2019-09-19 ENCOUNTER — Ambulatory Visit: Payer: PPO | Admitting: Physical Therapy

## 2019-09-19 DIAGNOSIS — M25652 Stiffness of left hip, not elsewhere classified: Secondary | ICD-10-CM

## 2019-09-19 DIAGNOSIS — M6281 Muscle weakness (generalized): Secondary | ICD-10-CM

## 2019-09-19 DIAGNOSIS — R2689 Other abnormalities of gait and mobility: Secondary | ICD-10-CM

## 2019-09-19 NOTE — Therapy (Signed)
[facility] [address] [place], [place], [address] Phone: [phone]   Fax:  [phone]  Physical Therapy Treatment  Patient Details  Name: Natalie Parker MRN: [id number] Date of Birth: 1951-08-01 Referring Provider (PT): Elby Showers   Encounter Date: 09/17/2019  PT End of Session - 09/19/19 0817    Visit Number  7    Number of Visits  9    Date for PT Re-Evaluation  10/12/19    Authorization Type  [employer]-will need 10th visit progress note    PT Start Time  1104    PT Stop Time  1144    PT Time Calculation (min)  40 min    Activity Tolerance  Patient tolerated treatment well;No increased pain   Reports 1/10 at end of session   Behavior During Therapy  [facility] for tasks assessed/performed       Past Medical History:  Diagnosis Date  . Depression   . Osteopenia   . Spinal stenosis     Past Surgical History:  Procedure Laterality Date  . repair torn meniscus    . TONSILLECTOMY      There were no vitals filed for this visit.  Subjective Assessment - 09/19/19 0807    Subjective  Today's a pretty good day.  L knee was bothering me last night, but better this morning.    Limitations  Sitting    How long can you stand comfortably?  Depends    Currently in Pain?  Yes    Pain Score  2     Pain Location  Hip    Pain Orientation  Left    Pain Descriptors / Indicators  Dull;Aching    Pain Type  Chronic pain    Pain Onset  More than a month ago    Pain Frequency  Intermittent    Aggravating Factors   not sure    Pain Relieving Factors  changing positions, heating, stretches                       [facility] Adult PT Treatment/Exercise - 09/19/19 0808      Ambulation/Gait   Ambulation/Gait  Yes    Ambulation/Gait Assistance  6: Modified independent (Device/Increase time)    Ambulation Distance (Feet)  120 Feet   100   Assistive device  None    Gait Pattern  Step-through  pattern;Trendelenburg;Antalgic    Stairs  Yes    Stairs Assistance  6: Modified independent (Device/Increase time)    Stair Management Technique  Two rails;Forwards;Alternating pattern    Number of Stairs  4   3 reps   Height of Stairs  6    Pre-Gait Activities  Cues with gait for abdominal activation, decreased Trendelenburg gait pattern noted throughout session today.  Pt is wearing heel insert at all times.    Gait Comments  Reiterated importance of use of handrails for alternating pattern; use of step-to pattern if carrying items or if no rails, for improved stability with stair negotiation.      Exercises   Exercises  Other Exercises;Lumbar;Knee/Hip    Other Exercises   Pt reports she has received her therapy ball.  Seated on large physioball: pelvic rocking laterally, then anterior/posterior, pelvic anterior/posterior tilts, LAQ x 5 reps, marching in place x 5 reps.  Then reciprocal UE/LE lifts with marching x 5 reps, then with LAQ x 5 reps.  Cues for abdominal activation for improved core stability.  Lumbar Exercises: Supine   Other Supine Lumbar Exercises  In lumbar decompression position:  leg press (quad sets) x 5 reps each leg, then leg lengthener x 5 reps each leg (leg lengthener to replicate manual distraction)   Review of HEP-pt return demo understanding     Lumbar Exercises: Prone   Other Prone Lumbar Exercises  Prone on elbows position, with pillow under hips, 5 seconds, x 5 reps, with rest/return to prone position in between.  Performed and provided this exercise to replicate manual distraction with therapist using sheet at low back.  (PT does perform this x 5 reps using sheet, 10-15 seconds each).      Knee/Hip Exercises: Standing   Forward Step Up  Left;1 set;10 reps;Hand Hold: 2;Step Height: 6"    Forward Step Up Limitations  Began with step taps to encourage L hip/knee flexion; reviewed HEP, with pt return demo understanding.       Supine anterior/posterior pelvic  tilts x 10 reps.      PT Education - 09/19/19 0816    Education Details  Added prone on elbows to HEP-see instructions    Person(s) Educated  Patient    Methods  Explanation;Demonstration;Handout    Comprehension  Verbalized understanding;Returned demonstration          PT Long Term Goals - 08/13/19 1921      PT LONG TERM GOAL #1   Title  Pt will be independent with HEP to address pain, flexibility, strength.  TARGET 09/14/2019 (may be delayed due to late start-scheduling conflicts)    Time  5    Period  Weeks    Status  New    Target Date  09/14/19      PT LONG TERM GOAL #2   Title  Pt will verbalize at least 3 means to decrease pain in L hip.    Time  5    Period  Weeks    Status  New    Target Date  09/14/19      PT LONG TERM GOAL #3   Title  Pt will perform standing and sitting activities > 10 minutes, with less than 2 point increase in pain in L hip, for improved ADLs.    Time  5    Period  Weeks    Status  New    Target Date  09/14/19      PT LONG TERM GOAL #4   Title  Pt will improve L hip abduction strength to at least 3+/5 to improve hip stability and decrease Trendelenburg/antalgic gait pattern.    Time  5    Period  Weeks    Status  New    Target Date  09/14/19            Plan - 09/19/19 0817    Clinical Impression Statement  Pt is continuing to rate overall decreased pain in L hip (2/10 beginning of session>1/10 at end) and continues to be performing/tolerating exercises well.  Exercises given to address stretching to low back/hip as well as hip strengthening and core stability.  Pt able to briefly tolerate prone on elbows position, given as HEP to replicate manual distraction that therapist performed with sheet at low back.  Pt is on target for progression to/meeting LTGs, and anticipate d/c in next session.    Personal Factors and Comorbidities  Comorbidity 3+    Comorbidities  PMH:  lumbar pain s/p surgery 2015, disc bulge L2-S1 MRI, OA     Examination-Activity Limitations  Sit;Sleep;Stand;Locomotion Level    Examination-Participation Restrictions  Community Activity    Stability/Clinical Decision Making  Evolving/Moderate complexity    Rehab Potential  Good    PT Frequency  2x / week    PT Duration  4 weeks   plus eval, total POC = 5 weeks   PT Treatment/Interventions  ADLs/Self Care Home Management;Cryotherapy;Gait training;DME Instruction;Ultrasound;Traction;Moist Heat;Iontophoresis 4mg /ml Dexamethasone;Functional mobility training;Therapeutic activities;Therapeutic exercise;Patient/family education;Manual techniques;Passive range of motion    PT Next Visit Plan  Review prone positioning, check LTGs and likely d/c    PT Home Exercise Plan  Access Code: [email or web address]    Consulted and Agree with Plan of Care  Patient       Patient will benefit from skilled therapeutic intervention in order to improve the following deficits and impairments:  Abnormal gait, Decreased range of motion, Decreased mobility, Decreased strength, Impaired flexibility, Difficulty walking  Visit Diagnosis: Stiffness of left hip, not elsewhere classified  Muscle weakness (generalized)  Other abnormalities of gait and mobility     Problem List Patient Active Problem List   Diagnosis Date Noted  . Depression 10/10/2012  . Insomnia 10/10/2012  . Bilateral hip pain 03/29/2012  . Spinal stenosis 09/29/2011  . Hot flashes 09/29/2011  . History of smoking 09/29/2011  . Osteopenia 09/29/2011  . Low back pain 09/27/2011    , W. 09/19/2019, 8:21 AM  Frazier Butt., PT   [facility] [address] [place], [place], [address] Phone: [phone]   Fax:  [phone]  Name: Natalie Parker MRN: [id number] Date of Birth: [birth year]

## 2019-09-20 NOTE — Therapy (Signed)
[facility] [address] [place], [place], [address] Phone: [phone]   Fax:  [phone]  Physical Therapy Treatment  Patient Details  Name: Natalie Parker MRN: [id number] Date of Birth: 06/24/1951 Referring Provider (PT): Elby Showers   Encounter Date: 09/19/2019  PT End of Session - 09/19/19 1105    Visit Number  8    Number of Visits  9    Date for PT Re-Evaluation  10/12/19    Authorization Type  [employer]-will need 10th visit progress note    PT Start Time  1102    PT Stop Time  1130   discharg visit, not all the time was needed   PT Time Calculation (min)  28 min    Activity Tolerance  Patient tolerated treatment well;No increased pain   Reports 1/10 at end of session   Behavior During Therapy  [facility] for tasks assessed/performed       Past Medical History:  Diagnosis Date  . Depression   . Osteopenia   . Spinal stenosis     Past Surgical History:  Procedure Laterality Date  . repair torn meniscus    . TONSILLECTOMY      There were no vitals filed for this visit.  Subjective Assessment - 09/19/19 1103    Subjective  Had a rough night, so woke up with more pain. Was able to do the leg lengthening ex with decreased pain.    Limitations  Sitting    How long can you sit comfortably?  Don't sit a long time because sitting is not comfortable.    How long can you stand comfortably?  Depends    Diagnostic tests  Hip x-ray is negative    Currently in Pain?  Yes    Pain Score  4     Pain Location  Hip    Pain Orientation  Left    Pain Descriptors / Indicators  Aching;Dull    Pain Type  Chronic pain    Pain Radiating Towards  down left LE    Pain Onset  More than a month ago    Pain Frequency  Intermittent    Aggravating Factors   not sure    Pain Relieving Factors  changing positions, heat, stretches         [facility] PT Assessment - 09/19/19 1117      Strength   Overall Strength  Comments  Grossly tested left hip flexors in hooklying for 5/5, left hip abductors in sidelying with 4/5. mild pain reported with testing.            [facility] Adult PT Treatment/Exercise - 09/19/19 1700      Self-Care   Self-Care  Other Self-Care Comments    Other Self-Care Comments   Pt able to verbalize multiple strategies/ways of reducing her pain. Pt also reports the ability to perform both sitting and standing activities for >10 minutes with less than a 2 point increase in her pain. Reports being able to do more for longer periods with less increase in pain as sell.      Exercises   Exercises  Other Exercises    Other Exercises   Reviewed with pt all ex's issued to date. Pt performed most recent ex's issued- prone on elbows and supine leg lengthening ex's in session with no cues needed. Verbally reviewed with pictures all other's that were issued and reviewed without pictures pball ex's.  PT Long Term Goals - 09/19/19 1107      PT LONG TERM GOAL #1   Title  Pt will be independent with HEP to address pain, flexibility, strength.  TARGET 09/14/2019 (may be delayed due to late start-scheduling conflicts)    Baseline  75/91/63: met with current HEP    Time  --    Period  --    Status  Achieved      PT LONG TERM GOAL #2   Title  Pt will verbalize at least 3 means to decrease pain in L hip.    Baseline  09/19/19: pt met this goal today    Status  Achieved      PT LONG TERM GOAL #3   Title  Pt will perform standing and sitting activities > 10 minutes, with less than 2 point increase in pain in L hip, for improved ADLs.    Baseline  09/19/19: met per pt report    Status  Achieved      PT LONG TERM GOAL #4   Title  Pt will improve L hip abduction strength to at least 3+/5 to improve hip stability and decrease Trendelenburg/antalgic gait pattern.    Baseline  09/19/19: met in session today    Time  --    Period  --    Status  Achieved            Plan -  09/19/19 1107    Clinical Impression Statement  Today's skilled session focused on progress toward LTG's with all goals met. Pt agreeable to discharge with no questions/concerns at end of session.    Personal Factors and Comorbidities  Comorbidity 3+    Comorbidities  PMH:  lumbar pain s/p surgery 2015, disc bulge L2-S1 MRI, OA    Examination-Activity Limitations  Sit;Sleep;Stand;Locomotion Level    Examination-Participation Restrictions  Community Activity    Stability/Clinical Decision Making  Evolving/Moderate complexity    Rehab Potential  Good    PT Frequency  2x / week    PT Duration  4 weeks   plus eval, total POC = 5 weeks   PT Treatment/Interventions  ADLs/Self Care Home Management;Cryotherapy;Gait training;DME Instruction;Ultrasound;Traction;Moist Heat;Iontophoresis 46m/ml Dexamethasone;Functional mobility training;Therapeutic activities;Therapeutic exercise;Patient/family education;Manual techniques;Passive range of motion    PT Next Visit Plan  discharge today per PT plan of care    PT Home Exercise Plan  Access Code: [email or web address]    Consulted and Agree with Plan of Care  Patient       Patient will benefit from skilled therapeutic intervention in order to improve the following deficits and impairments:  Abnormal gait, Decreased range of motion, Decreased mobility, Decreased strength, Impaired flexibility, Difficulty walking  Visit Diagnosis: Stiffness of left hip, not elsewhere classified  Muscle weakness (generalized)  Other abnormalities of gait and mobility     Problem List Patient Active Problem List   Diagnosis Date Noted  . Depression 10/10/2012  . Insomnia 10/10/2012  . Bilateral hip pain 03/29/2012  . Spinal stenosis 09/29/2011  . Hot flashes 09/29/2011  . History of smoking 09/29/2011  . Osteopenia 09/29/2011  . Low back pain 09/27/2011    KWillow Ora PTA, [facility] [place]  [phone]/22/20, 3:48 PM   Name: Natalie PAREMRN: [id number] of Birth: [birth year]

## 2019-09-24 ENCOUNTER — Ambulatory Visit: Payer: PPO | Admitting: Physical Therapy

## 2019-09-24 ENCOUNTER — Encounter: Payer: Self-pay | Admitting: Physical Therapy

## 2019-09-24 NOTE — Therapy (Signed)
[facility] [address] [place], [place], [address] Phone: [phone]   Fax:  [phone]  Patient Details  Name: Natalie Parker MRN: [id number] Date of Birth: 1950/12/29 Referring Provider:  No ref. provider found  Encounter Date: 09/24/2019  PHYSICAL THERAPY DISCHARGE SUMMARY  Visits from Start of Care: 8  Current functional level related to goals / functional outcomes: PT Long Term Goals - 09/19/19 1107      PT LONG TERM GOAL #1   Title  Pt will be independent with HEP to address pain, flexibility, strength.  TARGET 09/14/2019 (may be delayed due to late start-scheduling conflicts)    Baseline  32/20/25: met with current HEP    Time  --    Period  --    Status  Achieved      PT LONG TERM GOAL #2   Title  Pt will verbalize at least 3 means to decrease pain in L hip.    Baseline  09/19/19: pt met this goal today    Status  Achieved      PT LONG TERM GOAL #3   Title  Pt will perform standing and sitting activities > 10 minutes, with less than 2 point increase in pain in L hip, for improved ADLs.    Baseline  09/19/19: met per pt report    Status  Achieved      PT LONG TERM GOAL #4   Title  Pt will improve L hip abduction strength to at least 3+/5 to improve hip stability and decrease Trendelenburg/antalgic gait pattern.    Baseline  09/19/19: met in session today    Time  --    Period  --    Status  Achieved      Pt has met 4 of 4 LTGs and has overall decreased L hip pain.   Remaining deficits: Hip pain (pt is able to manage through exercises, stretching)   Education / Equipment: Educated in [employer], with pt return demo understanding.  Plan: Patient agrees to discharge.  Patient goals were not met. Patient is being discharged due to meeting the stated rehab goals.  ?????      MARRIOTT,AMY W. 09/24/2019, 9:49 AM Mady Haagensen, PT 09/24/19 9:50 AM Phone: [phone] Fax: [facility] [place] [address] [place], [place], [address] Phone: [phone]   Fax:  [phone]

## 2019-10-12 DIAGNOSIS — M47816 Spondylosis without myelopathy or radiculopathy, lumbar region: Secondary | ICD-10-CM | POA: Diagnosis not present

## 2019-10-12 DIAGNOSIS — M5416 Radiculopathy, lumbar region: Secondary | ICD-10-CM | POA: Diagnosis not present

## 2019-10-12 DIAGNOSIS — Z79891 Long term (current) use of opiate analgesic: Secondary | ICD-10-CM | POA: Diagnosis not present

## 2019-10-12 DIAGNOSIS — M150 Primary generalized (osteo)arthritis: Secondary | ICD-10-CM | POA: Diagnosis not present

## 2019-10-12 DIAGNOSIS — G894 Chronic pain syndrome: Secondary | ICD-10-CM | POA: Diagnosis not present

## 2019-12-24 ENCOUNTER — Other Ambulatory Visit: Payer: Self-pay | Admitting: Internal Medicine

## 2019-12-24 DIAGNOSIS — Z1231 Encounter for screening mammogram for malignant neoplasm of breast: Secondary | ICD-10-CM

## 2019-12-26 ENCOUNTER — Ambulatory Visit: Payer: PPO

## 2020-01-04 ENCOUNTER — Ambulatory Visit: Payer: PPO

## 2020-01-11 DIAGNOSIS — M47816 Spondylosis without myelopathy or radiculopathy, lumbar region: Secondary | ICD-10-CM | POA: Diagnosis not present

## 2020-01-11 DIAGNOSIS — G894 Chronic pain syndrome: Secondary | ICD-10-CM | POA: Diagnosis not present

## 2020-01-11 DIAGNOSIS — M5416 Radiculopathy, lumbar region: Secondary | ICD-10-CM | POA: Diagnosis not present

## 2020-01-11 DIAGNOSIS — M150 Primary generalized (osteo)arthritis: Secondary | ICD-10-CM | POA: Diagnosis not present

## 2020-01-16 ENCOUNTER — Ambulatory Visit: Payer: PPO

## 2020-01-28 ENCOUNTER — Other Ambulatory Visit: Payer: Self-pay

## 2020-01-28 ENCOUNTER — Ambulatory Visit
Admission: RE | Admit: 2020-01-28 | Discharge: 2020-01-28 | Disposition: A | Discharge status: Home / Self Care | Payer: PPO | Source: Ambulatory Visit | Attending: Internal Medicine | Admitting: Internal Medicine

## 2020-01-28 DIAGNOSIS — Z1231 Encounter for screening mammogram for malignant neoplasm of breast: Secondary | ICD-10-CM

## 2020-03-25 ENCOUNTER — Other Ambulatory Visit: Payer: PPO | Admitting: Internal Medicine

## 2020-03-25 ENCOUNTER — Other Ambulatory Visit: Payer: Self-pay

## 2020-03-25 ENCOUNTER — Encounter: Payer: PPO | Admitting: Internal Medicine

## 2020-03-25 DIAGNOSIS — E785 Hyperlipidemia, unspecified: Secondary | ICD-10-CM

## 2020-03-25 DIAGNOSIS — Z0000 Encounter for general adult medical examination without abnormal findings: Secondary | ICD-10-CM | POA: Diagnosis not present

## 2020-03-25 DIAGNOSIS — K219 Gastro-esophageal reflux disease without esophagitis: Secondary | ICD-10-CM | POA: Diagnosis not present

## 2020-03-25 DIAGNOSIS — M8580 Other specified disorders of bone density and structure, unspecified site: Secondary | ICD-10-CM

## 2020-03-25 DIAGNOSIS — G8929 Other chronic pain: Secondary | ICD-10-CM | POA: Diagnosis not present

## 2020-03-25 DIAGNOSIS — E2839 Other primary ovarian failure: Secondary | ICD-10-CM | POA: Diagnosis not present

## 2020-03-25 DIAGNOSIS — E7800 Pure hypercholesterolemia, unspecified: Secondary | ICD-10-CM | POA: Diagnosis not present

## 2020-03-25 DIAGNOSIS — M545 Low back pain, unspecified: Secondary | ICD-10-CM

## 2020-03-26 LAB — COMPLETE METABOLIC PANEL WITH GFR
AG Ratio: 1.8 (calc) (ref 1.0–2.5)
ALT: 15 U/L (ref 6–29)
AST: 21 U/L (ref 10–35)
Albumin: 4.6 g/dL (ref 3.6–5.1)
Alkaline phosphatase (APISO): 36 U/L — ABNORMAL LOW (ref 37–153)
BUN: 20 mg/dL (ref 7–25)
CO2: 27 mmol/L (ref 20–32)
Calcium: 10.2 mg/dL (ref 8.6–10.4)
Chloride: 104 mmol/L (ref 98–110)
Creat: 0.65 mg/dL (ref 0.50–0.99)
GFR, Est African American: 106 mL/min/{1.73_m2} (ref >=60)
GFR, Est Non African American: 92 mL/min/{1.73_m2} (ref >=60)
Globulin: 2.5 g/dL (calc) (ref 1.9–3.7)
Glucose, Bld: 102 mg/dL — ABNORMAL HIGH (ref 65–99)
Potassium: 4.2 mmol/L (ref 3.5–5.3)
Sodium: 139 mmol/L (ref 135–146)
Total Bilirubin: 0.6 mg/dL (ref 0.2–1.2)
Total Protein: 7.1 g/dL (ref 6.1–8.1)

## 2020-03-26 LAB — CBC WITH DIFFERENTIAL/PLATELET
Absolute Monocytes: 562 cells/uL (ref 200–950)
Basophils Absolute: 21 cells/uL (ref 0–200)
Basophils Relative: 0.4 %
Eosinophils Absolute: 109 cells/uL (ref 15–500)
Eosinophils Relative: 2.1 %
HCT: 41.9 % (ref 35.0–45.0)
Hemoglobin: 14.3 g/dL (ref 11.7–15.5)
Lymphs Abs: 2423 cells/uL (ref 850–3900)
MCH: 31.8 pg (ref 27.0–33.0)
MCHC: 34.1 g/dL (ref 32.0–36.0)
MCV: 93.1 fL (ref 80.0–100.0)
MPV: 9.1 fL (ref 7.5–12.5)
Monocytes Relative: 10.8 %
Neutro Abs: 2085 cells/uL (ref 1500–7800)
Neutrophils Relative %: 40.1 %
Platelets: 254 10*3/uL (ref 140–400)
RBC: 4.5 10*6/uL (ref 3.80–5.10)
RDW: 12.3 % (ref 11.0–15.0)
Total Lymphocyte: 46.6 %
WBC: 5.2 10*3/uL (ref 3.8–10.8)

## 2020-03-26 LAB — LIPID PANEL
Cholesterol: 209 mg/dL — ABNORMAL HIGH (ref <200)
HDL: 65 mg/dL (ref >=50)
LDL Cholesterol (Calc): 128 mg/dL (calc) — ABNORMAL HIGH
Non-HDL Cholesterol (Calc): 144 mg/dL (calc) — ABNORMAL HIGH (ref <130)
Total CHOL/HDL Ratio: 3.2 (calc) (ref <5.0)
Triglycerides: 72 mg/dL (ref <150)

## 2020-03-26 LAB — TSH: TSH: 1.25 mIU/L (ref 0.40–4.50)

## 2020-03-28 ENCOUNTER — Other Ambulatory Visit: Payer: Self-pay

## 2020-03-28 ENCOUNTER — Encounter: Payer: Self-pay | Admitting: Internal Medicine

## 2020-03-28 ENCOUNTER — Ambulatory Visit (INDEPENDENT_AMBULATORY_CARE_PROVIDER_SITE_OTHER): Payer: PPO | Admitting: Internal Medicine

## 2020-03-28 VITALS — BP 120/80 | HR 76 | Temp 98.7°F | Ht 66.5 in | Wt 188.0 lb

## 2020-03-28 DIAGNOSIS — Z0000 Encounter for general adult medical examination without abnormal findings: Secondary | ICD-10-CM

## 2020-03-28 DIAGNOSIS — K219 Gastro-esophageal reflux disease without esophagitis: Secondary | ICD-10-CM

## 2020-03-28 DIAGNOSIS — E7800 Pure hypercholesterolemia, unspecified: Secondary | ICD-10-CM

## 2020-03-28 DIAGNOSIS — Z8739 Personal history of other diseases of the musculoskeletal system and connective tissue: Secondary | ICD-10-CM

## 2020-03-28 LAB — POCT URINALYSIS DIPSTICK
Appearance: NEGATIVE
Bilirubin, UA: NEGATIVE
Blood, UA: NEGATIVE
Glucose, UA: NEGATIVE
Ketones, UA: NEGATIVE
Leukocytes, UA: NEGATIVE
Nitrite, UA: NEGATIVE
Odor: NEGATIVE
Protein, UA: NEGATIVE
Spec Grav, UA: 1.01 (ref 1.010–1.025)
Urobilinogen, UA: 0.2 E.U./dL
pH, UA: 6.5 (ref 5.0–8.0)

## 2020-03-28 NOTE — Progress Notes (Signed)
Subjective:    Patient ID: Natalie Parker, female    DOB: 1951-02-01, 69 y.o.   MRN: [id number]  HPI 69 year old Female for health maintenance exam, Medicare wellness and evaluation of medical issues.  Her weight is stable at 188 pounds.  BMI 29.89.  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  Enjoys swimming.  Social history: She is married.  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  She quit smoking around 2013 after smoking 1/2 pack/day for a number of years.  No children.  Husband is retired.  Family history: Father died at age 29 of a brain tumor.  2 brothers in good health.  Mother died at age 62 of unknown causes with history of psoriatic arthritis.  She has a history of lumbar back pain status post lumbar surgery by Dr. Vertell Limber in 2015.  MRI in 2015 showed bulging disc and neural foraminal involvement L2-S1.  Salonpas pain nail.  History of osteopenia, spinal stenosis, hot flashes and insomnia.  She tried Effexor for hot flashes but it caused a headache.  She used to take estrogen replacement but no longer does so.  Past medical history: Tonsillectomy 1974.  Colonoscopy by Dr. Watt Climes 2015 with no polyps found.  Was due for repeat study in 2020 but that was canceled due to the pandemic and she will need to call them to see if he needs to reschedule soon.  Right knee surgery 2010 with abrasion chondroplasty of the patella, lateral tibial plateau and medial femoral condyle.  Had synovectomy of the suprapatellar pouch and medial joint right knee.    Review of Systems swimming for exercise, had COVID-19 vaccines without issue.     Objective:   Physical Exam Blood pressure 120/80 pulse 76 temperature 98.7 degrees orally pulse oximetry 98% weight 188 pounds BMI 29.89  Skin warm and dry.  Nodes none.  TMs are clear.  Neck is supple without JVD thyromegaly or carotid bruits.  Chest clear to auscultation.  Cardiac exam regular rate and  rhythm normal S1 and S2 without murmurs or gallops.  Abdomen soft nondistended without hepatosplenomegaly masses or tenderness.  Pap taken in 2018 and not repeated due to age.  Bimanual normal.  Breast without masses.  No lower extremity edema.  Neuro no focal deficits.  Affect thought and judgment are normal.       Assessment & Plan:  Normal health maintenance exam  History of obesity but weight is stable at 188 pounds  History of GE reflux treated with Nexium  History of obesity  History of lumbar spinal stenosis status post surgery and does well with intermittent flares of back pain from time to time  Pure hypercholesterolemia-continue to monitor.  Total cholesterol was 209 and LDL cholesterol was 128.  Hold off on lipid-lowering medication and recheck in 1 year.  Fasting glucose is 102.  Watch carbohydrates.  Had 2 COVID-19 vaccines January 31, 2020 January 01, 2020 without issue.  Has had both pneumococcal immunizations.  Tetanus immunization will be due next year but Medicare may not pay for it.  Gets annual flu vaccine.  Had mammogram March 2021.  Plan: Follow-up in 1 year or as needed and continue diet and exercise efforts.  Subjective:   Patient presents for Medicare Annual/Subsequent preventive examination.  Review Past Medical/Family/Social: See above   Risk Factors  Current exercise habits: Swims regularly Dietary issues discussed: Low-fat low carbohydrate  Cardiac risk factors: Mild elevation LDL foci:  Depression Screen  (Note: if answer to either of the following is "Yes", a more complete depression screening is indicated)   Over the past two weeks, have you felt down, depressed or hopeless? No  Over the past two weeks, have you felt little interest or pleasure in doing things? No Have you lost interest or pleasure in daily life? No Do you often feel hopeless? No Do you cry easily over simple problems? No   Activities of Daily Living  In your present state of  health, do you have any difficulty performing the following activities?:   Driving? No  Managing money? No  Feeding yourself? No  Getting from bed to chair? No  Climbing a flight of stairs? No  Preparing food and eating?: No  Bathing or showering? No  Getting dressed: No  Getting to the toilet? No  Using the toilet:No  Moving around from place to place: No  In the past year have you fallen or had a near fall?:No  Are you sexually active? No  Do you have more than one partner? No   Hearing Difficulties:  do you often ask people to speak up or repeat themselves?  Yes sometimes Do you experience ringing or noises in your ears? No  Do you have difficulty understanding soft or whispered voices? No  Do you feel that you have a problem with memory? No Do you often misplace items? No    Home Safety:  Do you have a smoke alarm at your residence? Yes Do you have grab bars in the bathroom?  None Do you have throw rugs in your house?  None   Cognitive Testing  Alert? Yes Normal Appearance?Yes  Oriented to person? Yes Place? Yes  Time? Yes  Recall of three objects? Yes  Can perform simple calculations? Yes  Displays appropriate judgment?Yes  Can read the correct time from a watch face?Yes   List the Names of Other Physician/Practitioners you currently use:  See referral list for the physicians patient is currently seeing.     Review of Systems: See above  Objective:     General appearance: Appears stated age and  mildly obese  Head: Normocephalic, without obvious abnormality, atraumatic  Eyes: conj clear, EOMi PEERLA  Ears: normal TM's and external ear canals both ears  Nose: Nares normal. Septum midline. Mucosa normal. No drainage or sinus tenderness.  Throat: lips, mucosa, and tongue normal; teeth and gums normal  Neck: no adenopathy, no carotid bruit, no JVD, supple, symmetrical, trachea midline and thyroid not enlarged, symmetric, no tenderness/mass/nodules  No CVA  tenderness.  Lungs: clear to auscultation bilaterally  Breasts: normal appearance, no masses or tenderness Heart: regular rate and rhythm, S1, S2 normal, no murmur, click, rub or gallop  Abdomen: soft, non-tender; bowel sounds normal; no masses, no organomegaly  Musculoskeletal: ROM normal in all joints, no crepitus, no deformity, Normal muscle strengthen. Back  is symmetric, no curvature. Skin: Skin color, texture, turgor normal. No rashes or lesions  Lymph nodes: Cervical, supraclavicular, and axillary nodes normal.  Neurologic: CN 2 -12 Normal, Normal symmetric reflexes. Normal coordination and gait  Psych: Alert & Oriented x 3, Mood appear stable.    Assessment:    Annual wellness medicare exam   Plan:    During the course of the visit the patient was educated and counseled about appropriate screening and preventive services including:   Annual mammogram  Patient to call [facility] about repeat  colonoscopy due date     Patient Instructions (the written plan) was given to the patient.  Medicare Attestation  I have personally reviewed:  The patient's medical and social history  Their use of alcohol, tobacco or illicit drugs  Their current medications and supplements  The patient's functional ability including ADLs,fall risks, home safety risks, cognitive, and hearing and visual impairment  Diet and physical activities  Evidence for depression or mood disorders  The patient's weight, height, BMI, and visual acuity have been recorded in the chart. I have made referrals, counseling, and provided education to the patient based on review of the above and I have provided the patient with a written personalized care plan for preventive services.

## 2020-03-28 NOTE — Patient Instructions (Addendum)
It was a pleasure to see you today.  Continue to work on diet and exercise efforts.  Follow-up in 1 year or as needed.  Please call [facility] to see when colonoscopy is due.

## 2020-04-11 DIAGNOSIS — M47816 Spondylosis without myelopathy or radiculopathy, lumbar region: Secondary | ICD-10-CM | POA: Diagnosis not present

## 2020-04-11 DIAGNOSIS — M150 Primary generalized (osteo)arthritis: Secondary | ICD-10-CM | POA: Diagnosis not present

## 2020-04-11 DIAGNOSIS — M5416 Radiculopathy, lumbar region: Secondary | ICD-10-CM | POA: Diagnosis not present

## 2020-04-11 DIAGNOSIS — G894 Chronic pain syndrome: Secondary | ICD-10-CM | POA: Diagnosis not present

## 2020-04-22 DIAGNOSIS — Z86018 Personal history of other benign neoplasm: Secondary | ICD-10-CM | POA: Diagnosis not present

## 2020-04-22 DIAGNOSIS — D485 Neoplasm of uncertain behavior of skin: Secondary | ICD-10-CM | POA: Diagnosis not present

## 2020-04-22 DIAGNOSIS — L814 Other melanin hyperpigmentation: Secondary | ICD-10-CM | POA: Diagnosis not present

## 2020-04-22 DIAGNOSIS — L578 Other skin changes due to chronic exposure to nonionizing radiation: Secondary | ICD-10-CM | POA: Diagnosis not present

## 2020-04-22 DIAGNOSIS — D225 Melanocytic nevi of trunk: Secondary | ICD-10-CM | POA: Diagnosis not present

## 2020-04-22 DIAGNOSIS — D2271 Melanocytic nevi of right lower limb, including hip: Secondary | ICD-10-CM | POA: Diagnosis not present

## 2020-04-22 DIAGNOSIS — D2272 Melanocytic nevi of left lower limb, including hip: Secondary | ICD-10-CM | POA: Diagnosis not present

## 2020-04-22 DIAGNOSIS — L821 Other seborrheic keratosis: Secondary | ICD-10-CM | POA: Diagnosis not present

## 2020-04-22 DIAGNOSIS — D034 Melanoma in situ of scalp and neck: Secondary | ICD-10-CM | POA: Diagnosis not present

## 2020-05-06 DIAGNOSIS — D034 Melanoma in situ of scalp and neck: Secondary | ICD-10-CM | POA: Diagnosis not present

## 2020-07-11 DIAGNOSIS — M47816 Spondylosis without myelopathy or radiculopathy, lumbar region: Secondary | ICD-10-CM | POA: Diagnosis not present

## 2020-07-11 DIAGNOSIS — G894 Chronic pain syndrome: Secondary | ICD-10-CM | POA: Diagnosis not present

## 2020-07-11 DIAGNOSIS — M150 Primary generalized (osteo)arthritis: Secondary | ICD-10-CM | POA: Diagnosis not present

## 2020-07-11 DIAGNOSIS — Z79891 Long term (current) use of opiate analgesic: Secondary | ICD-10-CM | POA: Diagnosis not present

## 2020-08-18 ENCOUNTER — Telehealth: Payer: Self-pay | Admitting: Internal Medicine

## 2020-08-18 NOTE — Telephone Encounter (Signed)
Called patient, she will just hold off till things calm down a little.

## 2020-08-18 NOTE — Telephone Encounter (Signed)
Let's put off the Shingrix vaccine until the variant settles down. I can write her a new Rx good for a year. Maybe later in the year or the Spring.

## 2020-08-18 NOTE — Telephone Encounter (Signed)
Marche called to schedule her Flu shot and she was inquiring about the Shingles vaccine. She was wanting to know if you felt it was wise for her to go ahead and get it now with the delta variant being so active. If so she needs a new prescription, she is coming of 08/27/2020 to get Flu Vaccine.

## 2020-08-25 ENCOUNTER — Encounter: Payer: Self-pay | Admitting: Internal Medicine

## 2020-08-25 ENCOUNTER — Telehealth: Payer: Self-pay | Admitting: Internal Medicine

## 2020-08-25 ENCOUNTER — Ambulatory Visit (INDEPENDENT_AMBULATORY_CARE_PROVIDER_SITE_OTHER): Payer: PPO | Admitting: Internal Medicine

## 2020-08-25 ENCOUNTER — Other Ambulatory Visit: Payer: Self-pay

## 2020-08-25 VITALS — HR 93 | Temp 98.3°F

## 2020-08-25 DIAGNOSIS — R0981 Nasal congestion: Secondary | ICD-10-CM | POA: Diagnosis not present

## 2020-08-25 DIAGNOSIS — R05 Cough: Secondary | ICD-10-CM | POA: Diagnosis not present

## 2020-08-25 DIAGNOSIS — R059 Cough, unspecified: Secondary | ICD-10-CM

## 2020-08-25 MED ORDER — DOXYCYCLINE HYCLATE 100 MG PO TABS
100.0000 mg | ORAL_TABLET | Freq: Two times a day (BID) | ORAL | 0 refills | Status: DC
Start: 2020-08-25 — End: 2021-03-30

## 2020-08-25 MED ORDER — BENZONATATE 100 MG PO CAPS
100.0000 mg | ORAL_CAPSULE | Freq: Three times a day (TID) | ORAL | 0 refills | Status: DC | PRN
Start: 2020-08-25 — End: 2021-03-30

## 2020-08-25 NOTE — Patient Instructions (Addendum)
Take doxycycline 100 mg twice daily for 10 days.  Tessalon Perles up to 3 times daily as needed for cough.  Rest and drink plenty of fluids.  Quarantine at home until test results are back.  Avoid having company at this point in time until you are well and COVID-19 results are back

## 2020-08-25 NOTE — Telephone Encounter (Signed)
Natalie Parker [phone]  October called to say she has drainage in her head, this morning she seen a little pink in the mucus when blowing her nose, been coughing since Thursday, ears don't feel right and she has slight headache, she also feels congested in her chest. She feels like it is a sinus infection. No COVID exposure, Had 2nd COVID-19 vaccine on March 4.

## 2020-08-25 NOTE — Telephone Encounter (Signed)
Car visit

## 2020-08-25 NOTE — Progress Notes (Signed)
° °  Subjective:    Patient ID: Natalie Parker, female    DOB: August 30, 1951, 69 y.o.   MRN: [id number]  HPI 69 year old Female who has developed cough and congestion.  Patient was visited by her stepdaughter recently.  Stepdaughter had not been vaccinated for COVID-19.  Patient has come down with cough and congestion.  No documented fever.  No dysgeusia, headache, myalgias.  Husband is asymptomatic.  Patient received Bydureon injections in February and March of this year.  She has had 2 pneumococcal immunizations as well, Prevnar 13 and pneumococcal 23 in 2019 and 2020 respectively.  Gets annual flu vaccine.  Her general health is good.  No travel history.    Review of Systems see above-says she feels like she has a sinus infection     Objective:   Physical Exam Temperature 98.3 degrees pulse 93 pulse oximetry 99% her chest is clear to auscultation without rales or wheezing.  She sounds nasally congested when she speaks       Assessment & Plan:  Acute upper respiratory infection-possible maxillary sinusitis  Plan: COVID-19 and respiratory virus panels obtained today.  Start doxycycline 100 mg twice daily for 10 days.  She is on chronic pain management for back issues.  I have also prescribed Tessalon Perles 100 mg up to 3 times daily as needed for cough.  Rest and drink plenty of fluids.  She says stepdaughter is coming back to the area to visit.  Patient should quarantine for several days and not have visitors.

## 2020-08-26 LAB — SARS-COV-2 RNA,(COVID-19) QUALITATIVE NAAT: SARS CoV2 RNA: NOT DETECTED

## 2020-08-27 ENCOUNTER — Ambulatory Visit: Payer: PPO | Admitting: Internal Medicine

## 2020-08-28 LAB — RESPIRATORY VIRUS PANEL

## 2020-09-10 ENCOUNTER — Other Ambulatory Visit: Payer: Self-pay

## 2020-09-10 ENCOUNTER — Ambulatory Visit (INDEPENDENT_AMBULATORY_CARE_PROVIDER_SITE_OTHER): Payer: PPO | Admitting: Internal Medicine

## 2020-09-10 ENCOUNTER — Encounter: Payer: Self-pay | Admitting: Internal Medicine

## 2020-09-10 VITALS — BP 110/70 | HR 77 | Temp 98.6°F | Ht 66.5 in | Wt 197.0 lb

## 2020-09-10 DIAGNOSIS — Z23 Encounter for immunization: Secondary | ICD-10-CM

## 2020-09-10 DIAGNOSIS — M1811 Unilateral primary osteoarthritis of first carpometacarpal joint, right hand: Secondary | ICD-10-CM | POA: Diagnosis not present

## 2020-09-10 DIAGNOSIS — M79641 Pain in right hand: Secondary | ICD-10-CM | POA: Diagnosis not present

## 2020-09-10 NOTE — Patient Instructions (Signed)
Patient received a flu vaccine IM L deltoid, AV, CMA  

## 2020-09-10 NOTE — Progress Notes (Signed)
Flu Vaccine per CMA 

## 2020-10-06 DIAGNOSIS — M150 Primary generalized (osteo)arthritis: Secondary | ICD-10-CM | POA: Diagnosis not present

## 2020-10-06 DIAGNOSIS — M47816 Spondylosis without myelopathy or radiculopathy, lumbar region: Secondary | ICD-10-CM | POA: Diagnosis not present

## 2020-10-06 DIAGNOSIS — Z79891 Long term (current) use of opiate analgesic: Secondary | ICD-10-CM | POA: Diagnosis not present

## 2020-10-06 DIAGNOSIS — G894 Chronic pain syndrome: Secondary | ICD-10-CM | POA: Diagnosis not present

## 2020-11-03 DIAGNOSIS — Z86006 Personal history of melanoma in-situ: Secondary | ICD-10-CM | POA: Diagnosis not present

## 2020-11-03 DIAGNOSIS — L578 Other skin changes due to chronic exposure to nonionizing radiation: Secondary | ICD-10-CM | POA: Diagnosis not present

## 2020-11-03 DIAGNOSIS — D2272 Melanocytic nevi of left lower limb, including hip: Secondary | ICD-10-CM | POA: Diagnosis not present

## 2020-11-03 DIAGNOSIS — D2271 Melanocytic nevi of right lower limb, including hip: Secondary | ICD-10-CM | POA: Diagnosis not present

## 2020-11-03 DIAGNOSIS — L821 Other seborrheic keratosis: Secondary | ICD-10-CM | POA: Diagnosis not present

## 2020-11-03 DIAGNOSIS — L814 Other melanin hyperpigmentation: Secondary | ICD-10-CM | POA: Diagnosis not present

## 2020-11-03 DIAGNOSIS — L720 Epidermal cyst: Secondary | ICD-10-CM | POA: Diagnosis not present

## 2020-11-03 DIAGNOSIS — D225 Melanocytic nevi of trunk: Secondary | ICD-10-CM | POA: Diagnosis not present

## 2020-11-03 DIAGNOSIS — Z86018 Personal history of other benign neoplasm: Secondary | ICD-10-CM | POA: Diagnosis not present

## 2020-11-16 DIAGNOSIS — M25561 Pain in right knee: Secondary | ICD-10-CM | POA: Diagnosis not present

## 2020-12-30 ENCOUNTER — Other Ambulatory Visit: Payer: Self-pay | Admitting: Internal Medicine

## 2020-12-30 DIAGNOSIS — Z1231 Encounter for screening mammogram for malignant neoplasm of breast: Secondary | ICD-10-CM

## 2021-01-02 DIAGNOSIS — M150 Primary generalized (osteo)arthritis: Secondary | ICD-10-CM | POA: Diagnosis not present

## 2021-01-02 DIAGNOSIS — M47816 Spondylosis without myelopathy or radiculopathy, lumbar region: Secondary | ICD-10-CM | POA: Diagnosis not present

## 2021-01-02 DIAGNOSIS — Z79891 Long term (current) use of opiate analgesic: Secondary | ICD-10-CM | POA: Diagnosis not present

## 2021-01-02 DIAGNOSIS — G894 Chronic pain syndrome: Secondary | ICD-10-CM | POA: Diagnosis not present

## 2021-02-12 ENCOUNTER — Ambulatory Visit
Admission: RE | Admit: 2021-02-12 | Discharge: 2021-02-12 | Disposition: A | Discharge status: Home / Self Care | Payer: PPO | Source: Ambulatory Visit | Attending: Internal Medicine | Admitting: Internal Medicine

## 2021-02-12 ENCOUNTER — Other Ambulatory Visit: Payer: Self-pay

## 2021-02-12 DIAGNOSIS — Z1231 Encounter for screening mammogram for malignant neoplasm of breast: Secondary | ICD-10-CM | POA: Diagnosis not present

## 2021-03-26 ENCOUNTER — Other Ambulatory Visit: Payer: Self-pay

## 2021-03-26 ENCOUNTER — Other Ambulatory Visit: Payer: PPO | Admitting: Internal Medicine

## 2021-03-26 DIAGNOSIS — E7800 Pure hypercholesterolemia, unspecified: Secondary | ICD-10-CM | POA: Diagnosis not present

## 2021-03-26 DIAGNOSIS — Z0000 Encounter for general adult medical examination without abnormal findings: Secondary | ICD-10-CM | POA: Diagnosis not present

## 2021-03-26 DIAGNOSIS — Z1329 Encounter for screening for other suspected endocrine disorder: Secondary | ICD-10-CM | POA: Diagnosis not present

## 2021-03-26 DIAGNOSIS — K219 Gastro-esophageal reflux disease without esophagitis: Secondary | ICD-10-CM | POA: Diagnosis not present

## 2021-03-26 DIAGNOSIS — E2839 Other primary ovarian failure: Secondary | ICD-10-CM | POA: Diagnosis not present

## 2021-03-26 DIAGNOSIS — G8929 Other chronic pain: Secondary | ICD-10-CM | POA: Diagnosis not present

## 2021-03-26 DIAGNOSIS — M8580 Other specified disorders of bone density and structure, unspecified site: Secondary | ICD-10-CM | POA: Diagnosis not present

## 2021-03-26 DIAGNOSIS — M545 Low back pain, unspecified: Secondary | ICD-10-CM | POA: Diagnosis not present

## 2021-03-27 ENCOUNTER — Other Ambulatory Visit: Payer: PPO | Admitting: Internal Medicine

## 2021-03-27 DIAGNOSIS — G8929 Other chronic pain: Secondary | ICD-10-CM

## 2021-03-27 DIAGNOSIS — M8580 Other specified disorders of bone density and structure, unspecified site: Secondary | ICD-10-CM

## 2021-03-27 DIAGNOSIS — Z0000 Encounter for general adult medical examination without abnormal findings: Secondary | ICD-10-CM

## 2021-03-27 DIAGNOSIS — K219 Gastro-esophageal reflux disease without esophagitis: Secondary | ICD-10-CM

## 2021-03-27 DIAGNOSIS — E2839 Other primary ovarian failure: Secondary | ICD-10-CM

## 2021-03-27 DIAGNOSIS — E7800 Pure hypercholesterolemia, unspecified: Secondary | ICD-10-CM

## 2021-03-27 DIAGNOSIS — Z1329 Encounter for screening for other suspected endocrine disorder: Secondary | ICD-10-CM

## 2021-03-27 LAB — CBC WITH DIFFERENTIAL/PLATELET
Absolute Monocytes: 520 cells/uL (ref 200–950)
Basophils Absolute: 21 cells/uL (ref 0–200)
Basophils Relative: 0.4 %
Eosinophils Absolute: 120 cells/uL (ref 15–500)
Eosinophils Relative: 2.3 %
HCT: 40.9 % (ref 35.0–45.0)
Hemoglobin: 13.9 g/dL (ref 11.7–15.5)
Lymphs Abs: 2163 cells/uL (ref 850–3900)
MCH: 32.2 pg (ref 27.0–33.0)
MCHC: 34 g/dL (ref 32.0–36.0)
MCV: 94.7 fL (ref 80.0–100.0)
MPV: 9 fL (ref 7.5–12.5)
Monocytes Relative: 10 %
Neutro Abs: 2376 cells/uL (ref 1500–7800)
Neutrophils Relative %: 45.7 %
Platelets: 245 10*3/uL (ref 140–400)
RBC: 4.32 10*6/uL (ref 3.80–5.10)
RDW: 12.3 % (ref 11.0–15.0)
Total Lymphocyte: 41.6 %
WBC: 5.2 10*3/uL (ref 3.8–10.8)

## 2021-03-27 LAB — COMPLETE METABOLIC PANEL WITH GFR
AG Ratio: 2 (calc) (ref 1.0–2.5)
ALT: 16 U/L (ref 6–29)
AST: 22 U/L (ref 10–35)
Albumin: 4.7 g/dL (ref 3.6–5.1)
Alkaline phosphatase (APISO): 48 U/L (ref 37–153)
BUN: 22 mg/dL (ref 7–25)
CO2: 28 mmol/L (ref 20–32)
Calcium: 9.5 mg/dL (ref 8.6–10.4)
Chloride: 103 mmol/L (ref 98–110)
Creat: 0.64 mg/dL (ref 0.50–0.99)
GFR, Est African American: 106 mL/min/{1.73_m2} (ref >=60)
GFR, Est Non African American: 92 mL/min/{1.73_m2} (ref >=60)
Globulin: 2.3 g/dL (calc) (ref 1.9–3.7)
Glucose, Bld: 91 mg/dL (ref 65–99)
Potassium: 4.5 mmol/L (ref 3.5–5.3)
Sodium: 139 mmol/L (ref 135–146)
Total Bilirubin: 0.4 mg/dL (ref 0.2–1.2)
Total Protein: 7 g/dL (ref 6.1–8.1)

## 2021-03-27 LAB — LIPID PANEL
Cholesterol: 215 mg/dL — ABNORMAL HIGH (ref <200)
HDL: 74 mg/dL (ref >=50)
LDL Cholesterol (Calc): 124 mg/dL (calc) — ABNORMAL HIGH
Non-HDL Cholesterol (Calc): 141 mg/dL (calc) — ABNORMAL HIGH (ref <130)
Total CHOL/HDL Ratio: 2.9 (calc) (ref <5.0)
Triglycerides: 78 mg/dL (ref <150)

## 2021-03-27 LAB — TSH: TSH: 1.6 mIU/L (ref 0.40–4.50)

## 2021-03-30 ENCOUNTER — Other Ambulatory Visit: Payer: Self-pay

## 2021-03-30 ENCOUNTER — Ambulatory Visit (INDEPENDENT_AMBULATORY_CARE_PROVIDER_SITE_OTHER): Payer: PPO | Admitting: Internal Medicine

## 2021-03-30 ENCOUNTER — Encounter: Payer: Self-pay | Admitting: Internal Medicine

## 2021-03-30 VITALS — BP 130/80 | HR 91 | Ht 67.0 in | Wt 198.0 lb

## 2021-03-30 DIAGNOSIS — Z85820 Personal history of malignant melanoma of skin: Secondary | ICD-10-CM | POA: Diagnosis not present

## 2021-03-30 DIAGNOSIS — E7800 Pure hypercholesterolemia, unspecified: Secondary | ICD-10-CM | POA: Diagnosis not present

## 2021-03-30 DIAGNOSIS — M545 Low back pain, unspecified: Secondary | ICD-10-CM

## 2021-03-30 DIAGNOSIS — K219 Gastro-esophageal reflux disease without esophagitis: Secondary | ICD-10-CM | POA: Diagnosis not present

## 2021-03-30 DIAGNOSIS — G8929 Other chronic pain: Secondary | ICD-10-CM | POA: Diagnosis not present

## 2021-03-30 DIAGNOSIS — Z0000 Encounter for general adult medical examination without abnormal findings: Secondary | ICD-10-CM | POA: Diagnosis not present

## 2021-03-30 DIAGNOSIS — M19049 Primary osteoarthritis, unspecified hand: Secondary | ICD-10-CM | POA: Diagnosis not present

## 2021-03-30 LAB — POCT URINALYSIS DIPSTICK
Appearance: NEGATIVE
Bilirubin, UA: NEGATIVE
Blood, UA: NEGATIVE
Glucose, UA: NEGATIVE
Ketones, UA: NEGATIVE
Leukocytes, UA: NEGATIVE
Nitrite, UA: NEGATIVE
Odor: NEGATIVE
Protein, UA: NEGATIVE
Spec Grav, UA: 1.015 (ref 1.010–1.025)
Urobilinogen, UA: 0.2 E.U./dL
pH, UA: 6.5 (ref 5.0–8.0)

## 2021-03-30 MED ORDER — ROSUVASTATIN CALCIUM 5 MG PO TABS
ORAL_TABLET | ORAL | 3 refills | Status: DC
Start: 2021-03-30 — End: 2022-03-28

## 2021-03-30 NOTE — Progress Notes (Signed)
Subjective:    Patient ID: Natalie Parker, female    DOB: 23-Feb-1951, 70 y.o.   MRN: [id number]  HPI  70 year old Female for Medicare wellness,health maintenance exam and evaluation of medical issues.  In June 2021 had melanoma in situ removed by Dr. Renda Rolls, Dermatologist.  History of primary osteoarthritis first MCP joint right hand seen by Dr. Amedeo Plenty.  Has chronic back pain seen by Dr. Nicholaus Bloom for pain management.  Had lumbar surgery in 2015 by Dr. Vertell Limber.  MRI in 2015 showed bulging disc and neuroforaminal involvement L2-S1.  Social history: She is married.  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  She quit smoking around 2013 after smoking a half a pack daily for a number of years.  Husband also is retired.  Family history: Father died at age 53 of a brain tumor.  2 brothers in good health.  Mother died at age 28 of unknown causes with history of psoriatic arthritis.  Patient has history of osteopenia, spinal stenosis, hot flashes and insomnia.  She tried Effexor for hot flashes but it caused a headache.  Past medical history: Tonsillectomy 1974, colonoscopy by Dr. Leeroy Bock in April 2015 with no polyps being found.  Right knee surgery in 2010 with abrasion chondroplasty of the patella, lateral tibial plateau and medial femoral condyle.  She had synovectomy of the supra patellar pouch and medial joint right knee.      Review of Systems-no new complaints     Objective:   Physical Exam Blood pressure 130/80 pulse 91 pulse oximetry 98% weight 198 pounds BMI 31.01 (gained from 188 pounds in April 2021)  Skin: Warm and dry.  No cervical adenopathy.  TMs are clear.  Neck is supple without JVD thyromegaly or carotid bruits.  Chest is clear to auscultation.  Cardiac exam: Regular rate and rhythm normal S1 and S2 without murmurs or gallops.  Abdomen soft nondistended without hepatosplenomegaly.  Breasts without masses.  Pap deferred due to age.  Bimanual normal.   No lower extremity pitting edema.  Neuro is intact without focal deficits.       Assessment & Plan:  10 pound weight gain in the past year.  Work on diet exercise and weight loss.  History of osteoarthritis first MCP joint right hand seen by Dr. Amedeo Plenty.  Takes NSAIDs as needed.  GE reflux treated with Nexium  History of lumbar spinal stenosis status post surgery.  Chronic pain management with Dr. Nicholaus Bloom.  Takes hydrocodone APAP.  Also takes NSAIDs    Pure hypercholesterolemia-total cholesterol 215, HDL 74, triglycerides 78 and LDL 124.  No significant change over the past year in these readings.  Try Crestor 5 mg 3 times a week with follow-up in 6 months.  Subjective:   Patient presents for Medicare Annual/Subsequent preventive examination.  Review Past Medical/Family/Social: See above   Risk Factors  Current exercise habits: Light Dietary issues discussed: Low-fat low carbohydrate  Cardiac risk factors: Hyperlipidemia  Depression Screen  (Note: if answer to either of the following is "Yes", a more complete depression screening is indicated)   Over the past two weeks, have you felt down, depressed or hopeless? No  Over the past two weeks, have you felt little interest or pleasure in doing things? No Have you lost interest or pleasure in daily life? No Do you often feel hopeless? No Do you cry easily over simple problems? No   Activities of Daily Living  In your present  state of health, do you have any difficulty performing the following activities?:   Driving? No  Managing money? No  Feeding yourself? No  Getting from bed to chair? No  Climbing a flight of stairs? No  Preparing food and eating?: No  Bathing or showering? No  Getting dressed: No  Getting to the toilet? No  Using the toilet:No  Moving around from place to place: No  In the past year have you fallen or had a near fall?:No  Are you sexually active? No  Do you have more than one partner? No    Hearing Difficulties: No  Do you often ask people to speak up or repeat themselves? No  Do you experience ringing or noises in your ears?  Yes Do you have difficulty understanding soft or whispered voices? No  Do you feel that you have a problem with memory?  Yes Do you often misplace items? No    Home Safety:  Do you have a smoke alarm at your residence? Yes Do you have grab bars in the bathroom?  No Do you have throw rugs in your house?  None   Cognitive Testing  Alert? Yes Normal Appearance?Yes  Oriented to person? Yes Place? Yes  Time? Yes  Recall of three objects? Yes  Can perform simple calculations? Yes  Displays appropriate judgment?Yes  Can read the correct time from a watch face?Yes   List the Names of Other Physician/Practitioners you currently use:  See referral list for the physicians patient is currently seeing.     Review of Systems: See above   Objective:     General appearance: Appears stated age and mildly obese  Head: Normocephalic, without obvious abnormality, atraumatic  Eyes: conj clear, EOMi PEERLA  Ears: normal TM's and external ear canals both ears  Nose: Nares normal. Septum midline. Mucosa normal. No drainage or sinus tenderness.  Throat: lips, mucosa, and tongue normal; teeth and gums normal  Neck: no adenopathy, no carotid bruit, no JVD, supple, symmetrical, trachea midline and thyroid not enlarged, symmetric, no tenderness/mass/nodules  No CVA tenderness.  Lungs: clear to auscultation bilaterally  Breasts: normal appearance, no masses or tenderness Heart: regular rate and rhythm, S1, S2 normal, no murmur, click, rub or gallop  Abdomen: soft, non-tender; bowel sounds normal; no masses, no organomegaly  Musculoskeletal: ROM normal in all joints, no crepitus, no deformity, Normal muscle strengthen. Back  is symmetric, no curvature. Skin: Skin color, texture, turgor normal. No rashes or lesions  Lymph nodes: Cervical, supraclavicular, and  axillary nodes normal.  Neurologic: CN 2 -12 Normal, Normal symmetric reflexes. Normal coordination and gait  Psych: Alert & Oriented x 3, Mood appear stable.    Assessment:    Annual wellness medicare exam   Plan:    During the course of the visit the patient was educated and counseled about appropriate screening and preventive services including:   Annual mammogram  Colonoscopy is up-to-date     Patient Instructions (the written plan) was given to the patient.  Medicare Attestation  I have personally reviewed:  The patient's medical and social history  Their use of alcohol, tobacco or illicit drugs  Their current medications and supplements  The patient's functional ability including ADLs,fall risks, home safety risks, cognitive, and hearing and visual impairment  Diet and physical activities  Evidence for depression or mood disorders  The patient's weight, height, BMI, and visual acuity have been recorded in the chart. I have made referrals, counseling, and provided education to  the patient based on review of the above and I have provided the patient with a written personalized care plan for preventive services.

## 2021-03-30 NOTE — Patient Instructions (Addendum)
It was a pleasure to see you today.  Start Crestor 5 mg 3 times a week and follow-up in 6 months with lipid panel liver functions and office visit.  Try to restart exercise regimen.

## 2021-04-03 DIAGNOSIS — M47816 Spondylosis without myelopathy or radiculopathy, lumbar region: Secondary | ICD-10-CM | POA: Diagnosis not present

## 2021-04-03 DIAGNOSIS — G894 Chronic pain syndrome: Secondary | ICD-10-CM | POA: Diagnosis not present

## 2021-04-03 DIAGNOSIS — Z79891 Long term (current) use of opiate analgesic: Secondary | ICD-10-CM | POA: Diagnosis not present

## 2021-04-03 DIAGNOSIS — M150 Primary generalized (osteo)arthritis: Secondary | ICD-10-CM | POA: Diagnosis not present

## 2021-04-21 DIAGNOSIS — L821 Other seborrheic keratosis: Secondary | ICD-10-CM | POA: Diagnosis not present

## 2021-04-21 DIAGNOSIS — Z86006 Personal history of melanoma in-situ: Secondary | ICD-10-CM | POA: Diagnosis not present

## 2021-04-21 DIAGNOSIS — L738 Other specified follicular disorders: Secondary | ICD-10-CM | POA: Diagnosis not present

## 2021-04-21 DIAGNOSIS — L570 Actinic keratosis: Secondary | ICD-10-CM | POA: Diagnosis not present

## 2021-04-21 DIAGNOSIS — D2271 Melanocytic nevi of right lower limb, including hip: Secondary | ICD-10-CM | POA: Diagnosis not present

## 2021-04-21 DIAGNOSIS — L918 Other hypertrophic disorders of the skin: Secondary | ICD-10-CM | POA: Diagnosis not present

## 2021-04-21 DIAGNOSIS — D2272 Melanocytic nevi of left lower limb, including hip: Secondary | ICD-10-CM | POA: Diagnosis not present

## 2021-04-21 DIAGNOSIS — L578 Other skin changes due to chronic exposure to nonionizing radiation: Secondary | ICD-10-CM | POA: Diagnosis not present

## 2021-04-21 DIAGNOSIS — Z86018 Personal history of other benign neoplasm: Secondary | ICD-10-CM | POA: Diagnosis not present

## 2021-04-21 DIAGNOSIS — L814 Other melanin hyperpigmentation: Secondary | ICD-10-CM | POA: Diagnosis not present

## 2021-04-21 DIAGNOSIS — D225 Melanocytic nevi of trunk: Secondary | ICD-10-CM | POA: Diagnosis not present

## 2021-06-10 ENCOUNTER — Telehealth: Payer: Self-pay | Admitting: Internal Medicine

## 2021-06-10 NOTE — Telephone Encounter (Signed)
LVM to CB and let me know if she is taking cholesterol medication, if she is we need to schedule 6 month follow up in November with labs prior.

## 2021-06-11 NOTE — Telephone Encounter (Signed)
Patient is on medication, schedule appointment

## 2021-06-30 DIAGNOSIS — Z79891 Long term (current) use of opiate analgesic: Secondary | ICD-10-CM | POA: Diagnosis not present

## 2021-06-30 DIAGNOSIS — G894 Chronic pain syndrome: Secondary | ICD-10-CM | POA: Diagnosis not present

## 2021-06-30 DIAGNOSIS — M150 Primary generalized (osteo)arthritis: Secondary | ICD-10-CM | POA: Diagnosis not present

## 2021-06-30 DIAGNOSIS — M47816 Spondylosis without myelopathy or radiculopathy, lumbar region: Secondary | ICD-10-CM | POA: Diagnosis not present

## 2021-07-13 DIAGNOSIS — M1811 Unilateral primary osteoarthritis of first carpometacarpal joint, right hand: Secondary | ICD-10-CM | POA: Diagnosis not present

## 2021-09-29 DIAGNOSIS — M150 Primary generalized (osteo)arthritis: Secondary | ICD-10-CM | POA: Diagnosis not present

## 2021-09-29 DIAGNOSIS — G894 Chronic pain syndrome: Secondary | ICD-10-CM | POA: Diagnosis not present

## 2021-09-29 DIAGNOSIS — Z79891 Long term (current) use of opiate analgesic: Secondary | ICD-10-CM | POA: Diagnosis not present

## 2021-09-29 DIAGNOSIS — M47816 Spondylosis without myelopathy or radiculopathy, lumbar region: Secondary | ICD-10-CM | POA: Diagnosis not present

## 2021-10-12 ENCOUNTER — Other Ambulatory Visit: Payer: Self-pay

## 2021-10-12 ENCOUNTER — Other Ambulatory Visit: Payer: PPO | Admitting: Internal Medicine

## 2021-10-12 DIAGNOSIS — E7800 Pure hypercholesterolemia, unspecified: Secondary | ICD-10-CM

## 2021-10-13 LAB — LIPID PANEL
Cholesterol: 165 mg/dL (ref <200)
HDL: 64 mg/dL (ref >=50)
LDL Cholesterol (Calc): 86 mg/dL (calc)
Non-HDL Cholesterol (Calc): 101 mg/dL (calc) (ref <130)
Total CHOL/HDL Ratio: 2.6 (calc) (ref <5.0)
Triglycerides: 61 mg/dL (ref <150)

## 2021-10-15 ENCOUNTER — Other Ambulatory Visit: Payer: Self-pay

## 2021-10-15 ENCOUNTER — Encounter: Payer: Self-pay | Admitting: Internal Medicine

## 2021-10-15 ENCOUNTER — Ambulatory Visit (INDEPENDENT_AMBULATORY_CARE_PROVIDER_SITE_OTHER): Payer: PPO | Admitting: Internal Medicine

## 2021-10-15 VITALS — BP 108/68 | HR 88 | Temp 98.4°F | Resp 16 | Ht 67.0 in | Wt 198.0 lb

## 2021-10-15 DIAGNOSIS — M545 Low back pain, unspecified: Secondary | ICD-10-CM

## 2021-10-15 DIAGNOSIS — G8929 Other chronic pain: Secondary | ICD-10-CM | POA: Diagnosis not present

## 2021-10-15 DIAGNOSIS — E7800 Pure hypercholesterolemia, unspecified: Secondary | ICD-10-CM | POA: Diagnosis not present

## 2021-10-15 DIAGNOSIS — Z6831 Body mass index (BMI) 31.0-31.9, adult: Secondary | ICD-10-CM

## 2021-10-15 NOTE — Progress Notes (Signed)
   Subjective:    Patient ID: Natalie Parker, female    DOB: 09-03-1951, 70 y.o.   MRN: [id number]  HPI 70 year old Female seen for 6 month recheck.  She sees Dr. Nicholaus Bloom for chronic pain management with history of lumbar back pain/osteoarthritis.  She has a history of pure hypercholesterolemia.  History of GE reflux.  History of melanoma and has hand arthritis.  She is retired and has moved to [place].  [place]  Review of recent lab work shows her lipids to be entirely within normal limits.  6 months ago she was started on Crestor 5 mg 3 times a week.  6 months ago total cholesterol was 215 and is now 165.  LDL has improved from 124 to 86.  Response to this dose of Crestor is excellent.  Review of Systems no new complaints     Objective:   Physical Exam Blood pressure 108/68 pulse 98 regular respiratory rate 16 temperature 98.4 degrees pulse oximetry is 97% on room air weight 198 pounds BMI 31.01  Previously weighed 198 pounds in April 2021.  Used to swim some for exercise but moving has been hectic and she has not really done much exercise other than with activity surrounding her recent move       Assessment & Plan:   Pure hypercholesterolemia-excellent response to Crestor 5 mg 3 times a week  Chronic pain management per Dr. Nicholaus Bloom for chronic lumbar back pain  BMI 31-needs to lose some weight.  Encourage diet exercise and weight loss.  Plan: Continue current medications and return in 6 months for Medicare wellness and health maintenance exam.  Immunizations reviewed and are up-to-date except for COVID booster this Fall.

## 2021-10-18 NOTE — Patient Instructions (Signed)
It was a pleasure to see you today.  Excellent response to Crestor 5 mg 3 times a week with regard to lipid management.  Continue this and follow-up in 6 months.  Encourage diet exercise and weight loss.  Consider COVID booster this Fall.  Has had flu vaccine.

## 2021-10-19 DIAGNOSIS — Z23 Encounter for immunization: Secondary | ICD-10-CM | POA: Diagnosis not present

## 2021-10-19 DIAGNOSIS — D225 Melanocytic nevi of trunk: Secondary | ICD-10-CM | POA: Diagnosis not present

## 2021-10-19 DIAGNOSIS — Z86006 Personal history of melanoma in-situ: Secondary | ICD-10-CM | POA: Diagnosis not present

## 2021-10-19 DIAGNOSIS — L578 Other skin changes due to chronic exposure to nonionizing radiation: Secondary | ICD-10-CM | POA: Diagnosis not present

## 2021-10-19 DIAGNOSIS — D485 Neoplasm of uncertain behavior of skin: Secondary | ICD-10-CM | POA: Diagnosis not present

## 2021-10-19 DIAGNOSIS — L821 Other seborrheic keratosis: Secondary | ICD-10-CM | POA: Diagnosis not present

## 2021-10-19 DIAGNOSIS — L820 Inflamed seborrheic keratosis: Secondary | ICD-10-CM | POA: Diagnosis not present

## 2021-10-19 DIAGNOSIS — Z86018 Personal history of other benign neoplasm: Secondary | ICD-10-CM | POA: Diagnosis not present

## 2021-10-19 DIAGNOSIS — D2271 Melanocytic nevi of right lower limb, including hip: Secondary | ICD-10-CM | POA: Diagnosis not present

## 2021-10-19 DIAGNOSIS — L814 Other melanin hyperpigmentation: Secondary | ICD-10-CM | POA: Diagnosis not present

## 2021-10-19 DIAGNOSIS — D2272 Melanocytic nevi of left lower limb, including hip: Secondary | ICD-10-CM | POA: Diagnosis not present

## 2021-12-08 ENCOUNTER — Ambulatory Visit
Admission: RE | Admit: 2021-12-08 | Discharge: 2021-12-08 | Disposition: A | Discharge status: Home / Self Care | Payer: PPO | Source: Ambulatory Visit | Attending: Anesthesiology | Admitting: Anesthesiology

## 2021-12-08 ENCOUNTER — Other Ambulatory Visit: Payer: Self-pay | Admitting: Anesthesiology

## 2021-12-08 DIAGNOSIS — M25511 Pain in right shoulder: Secondary | ICD-10-CM

## 2021-12-08 DIAGNOSIS — G894 Chronic pain syndrome: Secondary | ICD-10-CM | POA: Diagnosis not present

## 2021-12-08 DIAGNOSIS — M150 Primary generalized (osteo)arthritis: Secondary | ICD-10-CM | POA: Diagnosis not present

## 2021-12-08 DIAGNOSIS — M47816 Spondylosis without myelopathy or radiculopathy, lumbar region: Secondary | ICD-10-CM | POA: Diagnosis not present

## 2021-12-08 DIAGNOSIS — Z79891 Long term (current) use of opiate analgesic: Secondary | ICD-10-CM | POA: Diagnosis not present

## 2021-12-19 ENCOUNTER — Ambulatory Visit
Admission: EM | Admit: 2021-12-19 | Discharge: 2021-12-19 | Disposition: A | Discharge status: Home / Self Care | Payer: PPO | Attending: Urgent Care | Admitting: Urgent Care

## 2021-12-19 ENCOUNTER — Ambulatory Visit (INDEPENDENT_AMBULATORY_CARE_PROVIDER_SITE_OTHER): Payer: PPO

## 2021-12-19 ENCOUNTER — Other Ambulatory Visit: Payer: Self-pay

## 2021-12-19 DIAGNOSIS — Z87891 Personal history of nicotine dependence: Secondary | ICD-10-CM

## 2021-12-19 DIAGNOSIS — J069 Acute upper respiratory infection, unspecified: Secondary | ICD-10-CM

## 2021-12-19 DIAGNOSIS — R0989 Other specified symptoms and signs involving the circulatory and respiratory systems: Secondary | ICD-10-CM | POA: Diagnosis not present

## 2021-12-19 DIAGNOSIS — R079 Chest pain, unspecified: Secondary | ICD-10-CM | POA: Diagnosis not present

## 2021-12-19 DIAGNOSIS — R059 Cough, unspecified: Secondary | ICD-10-CM

## 2021-12-19 MED ORDER — PROMETHAZINE-DM 6.25-15 MG/5ML PO SYRP: 5.0000 mL | ORAL_SOLUTION | Freq: Every evening | ORAL | 0 refills | Status: DC | PRN

## 2021-12-19 MED ORDER — CETIRIZINE HCL 10 MG PO TABS: 10.0000 mg | ORAL_TABLET | Freq: Every day | ORAL | 0 refills | Status: DC

## 2021-12-19 MED ORDER — PSEUDOEPHEDRINE HCL 30 MG PO TABS: 30.0000 mg | ORAL_TABLET | Freq: Three times a day (TID) | ORAL | 0 refills | Status: DC | PRN

## 2021-12-19 MED ORDER — BENZONATATE 100 MG PO CAPS: 100.0000 mg | ORAL_CAPSULE | Freq: Three times a day (TID) | ORAL | 0 refills | Status: DC | PRN

## 2021-12-19 NOTE — Discharge Instructions (Addendum)
We will manage this as a viral syndrome. For sore throat or cough try using a honey-based tea. Use 3 teaspoons of honey with juice squeezed from half lemon. Place shaved pieces of ginger into 1/2-1 cup of water and warm over stove top. Then mix the ingredients and repeat every 4 hours as needed. Please take Tylenol 500mg -650mg  once every 6 hours for fevers, aches and pains. Hydrate very well with at least 2 liters (64 ounces) of water. Eat light meals such as soups (chicken and noodles, chicken wild rice, vegetable).  Do not eat any foods that you are allergic to.  Start an antihistamine like Zyrtec for postnasal drainage, sinus congestion.  You can take this together with pseudoephedrine (Sudafed) at a dose of 60 mg 3 times a day or twice daily as needed for the same kind of congestion.  Use the cough medications as needed.

## 2021-12-19 NOTE — ED Triage Notes (Signed)
Pt reports slightly headache, slightly sinus pressure, bilateral ear pain and cough x 3 days.

## 2021-12-19 NOTE — ED Provider Notes (Signed)
[facility]   MRN: [id number] DOB: 12-Feb-1951  Subjective:   Natalie Parker is a 71 y.o. female presenting for 3-day history of acute onset persistent cough, sinus pressure, bilateral ear pain, sinus headache.  No fever, body aches, chest pain, shortness of breath or wheezing.  Patient has longstanding history of smoking, has generally done 1/2ppd and has had a couple of times where she quit, the most recent time that she quit was 6 months ago.  No history of COPD or asthma.  Patient did a COVID test at home and was negative.  No current facility-administered medications for this encounter.  Current Outpatient Medications:    Ascorbic Acid (VITAMIN C) 100 MG tablet, Take 100 mg by mouth daily., Disp: , Rfl:    cholecalciferol (VITAMIN D) 1000 UNITS tablet, Take 2,000 Units by mouth daily., Disp: , Rfl:    Cranberry 125 MG TABS, Take by mouth., Disp: , Rfl:    cyclobenzaprine (FLEXERIL) 10 MG tablet, Take 10 mg by mouth at bedtime., Disp: , Rfl: 2   HYDROcodone-acetaminophen (NORCO/VICODIN) 5-325 MG tablet, Take 1 tablet by mouth 2 (two) times daily., Disp: , Rfl: 0   meloxicam (MOBIC) 15 MG tablet, Take 1 tablet by mouth daily as needed. (Patient not taking: Reported on 10/15/2021), Disp: , Rfl:    Multiple Vitamin (MULTIVITAMIN) tablet, Take 1 tablet by mouth daily., Disp: , Rfl:    naproxen sodium (ALEVE) 220 MG tablet, Take 220 mg by mouth 2 (two) times daily as needed., Disp: , Rfl:    Omega-3 Fatty Acids (FISH OIL) 1000 MG CAPS, Take by mouth., Disp: , Rfl:    rosuvastatin (CRESTOR) 5 MG tablet, Take as directed: one tab with supper 3 times a week, Disp: 36 tablet, Rfl: 3   TURMERIC PO, Take by mouth., Disp: , Rfl:    Vitamin D-Vitamin K (VITAMIN K2-VITAMIN D3 PO), Take by mouth., Disp: , Rfl:    Allergies  Allergen Reactions   E-Mycin [Erythromycin Base] Rash    Past Medical History:  Diagnosis Date   Depression    Osteopenia    Spinal stenosis      Past  Surgical History:  Procedure Laterality Date   repair torn meniscus     TONSILLECTOMY      Family History  Problem Relation Age of Onset   Other Mother    Brain cancer Father    Healthy Brother    Healthy Brother     Social History   Tobacco Use   Smoking status: Former    Packs/day: 0.50    Years: 20.00    Pack years: 10.00    Types: Cigarettes    Quit date: 02/29/2012    Years since quitting: 9.8   Smokeless tobacco: Never  Substance Use Topics   Alcohol use: Yes    Comment: occasional   Drug use: No    ROS   Objective:   Vitals: BP 122/67 (BP Location: Right Arm)    Pulse 77    Temp 98.8 F (37.1 C) (Oral)    Resp 18    SpO2 95%   Physical Exam Constitutional:      General: She is not in acute distress.    Appearance: Normal appearance. She is well-developed and normal weight. She is not ill-appearing, toxic-appearing or diaphoretic.  HENT:     Head: Normocephalic and atraumatic.     Right Ear: Tympanic membrane, ear canal and external ear normal. No drainage or tenderness. No middle ear  effusion. There is no impacted cerumen. Tympanic membrane is not erythematous.     Left Ear: Tympanic membrane, ear canal and external ear normal. No drainage or tenderness.  No middle ear effusion. There is no impacted cerumen. Tympanic membrane is not erythematous.     Nose: Nose normal. No congestion or rhinorrhea.     Mouth/Throat:     Mouth: Mucous membranes are moist. No oral lesions.     Pharynx: No pharyngeal swelling, oropharyngeal exudate, posterior oropharyngeal erythema or uvula swelling.     Tonsils: No tonsillar exudate or tonsillar abscesses.  Eyes:     General: No scleral icterus.       Right eye: No discharge.        Left eye: No discharge.     Extraocular Movements: Extraocular movements intact.     Right eye: Normal extraocular motion.     Left eye: Normal extraocular motion.     Conjunctiva/sclera: Conjunctivae normal.  Cardiovascular:     Rate and  Rhythm: Normal rate.     Heart sounds: No murmur heard.   No friction rub. No gallop.  Pulmonary:     Effort: Pulmonary effort is normal. No respiratory distress.     Breath sounds: No stridor. Examination of the right-lower field reveals decreased breath sounds. Examination of the left-lower field reveals decreased breath sounds. Decreased breath sounds present. No wheezing, rhonchi or rales.  Chest:     Chest wall: No tenderness.  Musculoskeletal:     Cervical back: Normal range of motion and neck supple.  Lymphadenopathy:     Cervical: No cervical adenopathy.  Skin:    General: Skin is warm and dry.  Neurological:     General: No focal deficit present.     Mental Status: She is alert and oriented to person, place, and time.  Psychiatric:        Mood and Affect: Mood normal.        Behavior: Behavior normal.   DG Chest 2 View  Result Date: 12/19/2021 CLINICAL DATA:  71 year old female with cough and decreased lung sounds. Bilateral ear pain. Former smoker. EXAM: CHEST - 2 VIEW COMPARISON:  Chest radiographs 06/29/2004. FINDINGS: Larger lung volumes since 2005 but within normal limits. Normal cardiac size and mediastinal contours. Visualized tracheal air column is within normal limits. Symmetric and chronic appearing increased pulmonary interstitial markings, likely smoking related. No pneumothorax, pulmonary edema, pleural effusion or confluent pulmonary opacity. No acute osseous abnormality identified. Negative visible bowel gas. IMPRESSION: No acute cardiopulmonary abnormality identified. Electronically Signed   By: Genevie Ann M.D.   On: 12/19/2021 09:23     Assessment and Plan :   PDMP not reviewed this encounter.  1. Viral URI with cough   2. Former smoker    Given negative chest x-ray, negative COVID test at home will defer repeat COVID testing.  Recommend supportive care for viral upper respiratory infection. Counseled patient on potential for adverse effects with medications  prescribed/recommended today, ER and return-to-clinic precautions discussed, patient verbalized understanding.    Jaynee Eagles, [place] 12/19/21 [phone]

## 2022-01-11 ENCOUNTER — Other Ambulatory Visit: Payer: Self-pay | Admitting: Internal Medicine

## 2022-01-11 DIAGNOSIS — Z1231 Encounter for screening mammogram for malignant neoplasm of breast: Secondary | ICD-10-CM

## 2022-02-09 DIAGNOSIS — G894 Chronic pain syndrome: Secondary | ICD-10-CM | POA: Diagnosis not present

## 2022-02-09 DIAGNOSIS — M150 Primary generalized (osteo)arthritis: Secondary | ICD-10-CM | POA: Diagnosis not present

## 2022-02-09 DIAGNOSIS — Z79891 Long term (current) use of opiate analgesic: Secondary | ICD-10-CM | POA: Diagnosis not present

## 2022-02-09 DIAGNOSIS — M47816 Spondylosis without myelopathy or radiculopathy, lumbar region: Secondary | ICD-10-CM | POA: Diagnosis not present

## 2022-02-15 ENCOUNTER — Ambulatory Visit
Admission: RE | Admit: 2022-02-15 | Discharge: 2022-02-15 | Disposition: A | Discharge status: Home / Self Care | Payer: PPO | Source: Ambulatory Visit | Attending: Internal Medicine | Admitting: Internal Medicine

## 2022-02-15 DIAGNOSIS — Z1231 Encounter for screening mammogram for malignant neoplasm of breast: Secondary | ICD-10-CM

## 2022-02-16 DIAGNOSIS — H2513 Age-related nuclear cataract, bilateral: Secondary | ICD-10-CM | POA: Diagnosis not present

## 2022-03-09 ENCOUNTER — Telehealth: Payer: PPO | Admitting: Physician Assistant

## 2022-03-09 DIAGNOSIS — N3000 Acute cystitis without hematuria: Secondary | ICD-10-CM

## 2022-03-09 MED ORDER — CEPHALEXIN 500 MG PO CAPS: 500.0000 mg | ORAL_CAPSULE | Freq: Two times a day (BID) | ORAL | 0 refills | Status: AC

## 2022-03-09 NOTE — Progress Notes (Signed)

## 2022-03-28 ENCOUNTER — Other Ambulatory Visit: Payer: Self-pay | Admitting: Internal Medicine

## 2022-03-29 ENCOUNTER — Other Ambulatory Visit: Payer: PPO | Admitting: Internal Medicine

## 2022-04-01 ENCOUNTER — Ambulatory Visit: Payer: PPO | Admitting: Internal Medicine

## 2022-04-20 DIAGNOSIS — Z86006 Personal history of melanoma in-situ: Secondary | ICD-10-CM | POA: Diagnosis not present

## 2022-04-20 DIAGNOSIS — D229 Melanocytic nevi, unspecified: Secondary | ICD-10-CM | POA: Diagnosis not present

## 2022-04-20 DIAGNOSIS — D179 Benign lipomatous neoplasm, unspecified: Secondary | ICD-10-CM | POA: Diagnosis not present

## 2022-04-20 DIAGNOSIS — L821 Other seborrheic keratosis: Secondary | ICD-10-CM | POA: Diagnosis not present

## 2022-04-20 DIAGNOSIS — D1801 Hemangioma of skin and subcutaneous tissue: Secondary | ICD-10-CM | POA: Diagnosis not present

## 2022-04-20 DIAGNOSIS — L570 Actinic keratosis: Secondary | ICD-10-CM | POA: Diagnosis not present

## 2022-04-20 DIAGNOSIS — L578 Other skin changes due to chronic exposure to nonionizing radiation: Secondary | ICD-10-CM | POA: Diagnosis not present

## 2022-04-20 DIAGNOSIS — L814 Other melanin hyperpigmentation: Secondary | ICD-10-CM | POA: Diagnosis not present

## 2022-05-04 DIAGNOSIS — Z79891 Long term (current) use of opiate analgesic: Secondary | ICD-10-CM | POA: Diagnosis not present

## 2022-05-04 DIAGNOSIS — G894 Chronic pain syndrome: Secondary | ICD-10-CM | POA: Diagnosis not present

## 2022-05-04 DIAGNOSIS — M47816 Spondylosis without myelopathy or radiculopathy, lumbar region: Secondary | ICD-10-CM | POA: Diagnosis not present

## 2022-05-04 DIAGNOSIS — M150 Primary generalized (osteo)arthritis: Secondary | ICD-10-CM | POA: Diagnosis not present

## 2022-05-13 ENCOUNTER — Encounter: Payer: Self-pay | Admitting: Internal Medicine

## 2022-05-13 ENCOUNTER — Telehealth: Payer: Self-pay | Admitting: Internal Medicine

## 2022-05-13 ENCOUNTER — Telehealth (INDEPENDENT_AMBULATORY_CARE_PROVIDER_SITE_OTHER): Payer: PPO | Admitting: Internal Medicine

## 2022-05-13 DIAGNOSIS — U071 COVID-19: Secondary | ICD-10-CM

## 2022-05-13 MED ORDER — AMOXICILLIN 500 MG PO CAPS: 500.0000 mg | ORAL_CAPSULE | Freq: Three times a day (TID) | ORAL | 0 refills | Status: AC

## 2022-05-13 MED ORDER — HYDROCODONE BIT-HOMATROP MBR 5-1.5 MG/5ML PO SOLN: 5.0000 mL | Freq: Three times a day (TID) | ORAL | 0 refills | Status: DC | PRN

## 2022-05-13 NOTE — Telephone Encounter (Signed)
Natalie Parker called back to say that pharmacy does not have cough medicine. She said she would just wait on that she is not coughing that much any way. I did remind her you were out of office until Monday.

## 2022-05-13 NOTE — Telephone Encounter (Signed)
Natalie Parker [phone]  Marica called to say she has just tested positive for COVID, she had visited her mom on Sunday who is also positive for COVID, Sharifah started on Monday with runny nose, headache, body aches, little cough, stayed in bed all day for 2 days, felt better on Wednesday, feels bad today and tested positive today. Scheduled video visit at 12:00. She will get tempeture, does not have blood pressure machine.

## 2022-05-21 ENCOUNTER — Other Ambulatory Visit: Payer: PPO

## 2022-05-21 DIAGNOSIS — R5383 Other fatigue: Secondary | ICD-10-CM

## 2022-05-21 DIAGNOSIS — F32A Depression, unspecified: Secondary | ICD-10-CM | POA: Diagnosis not present

## 2022-05-21 DIAGNOSIS — E7800 Pure hypercholesterolemia, unspecified: Secondary | ICD-10-CM

## 2022-05-22 ENCOUNTER — Encounter: Payer: Self-pay | Admitting: Internal Medicine

## 2022-05-22 LAB — COMPLETE METABOLIC PANEL WITH GFR
AG Ratio: 1.7 (calc) (ref 1.0–2.5)
ALT: 23 U/L (ref 6–29)
AST: 24 U/L (ref 10–35)
Albumin: 4.2 g/dL (ref 3.6–5.1)
Alkaline phosphatase (APISO): 52 U/L (ref 37–153)
BUN: 21 mg/dL (ref 7–25)
CO2: 29 mmol/L (ref 20–32)
Calcium: 9.3 mg/dL (ref 8.6–10.4)
Chloride: 105 mmol/L (ref 98–110)
Creat: 0.62 mg/dL (ref 0.50–1.05)
Globulin: 2.5 g/dL (calc) (ref 1.9–3.7)
Glucose, Bld: 82 mg/dL (ref 65–99)
Potassium: 4.6 mmol/L (ref 3.5–5.3)
Sodium: 141 mmol/L (ref 135–146)
Total Bilirubin: 0.4 mg/dL (ref 0.2–1.2)
Total Protein: 6.7 g/dL (ref 6.1–8.1)
eGFR: 96 mL/min/{1.73_m2} (ref >=60)

## 2022-05-22 LAB — CBC WITH DIFFERENTIAL/PLATELET
Absolute Monocytes: 739 cells/uL (ref 200–950)
Basophils Absolute: 33 cells/uL (ref 0–200)
Basophils Relative: 0.5 %
Eosinophils Absolute: 139 cells/uL (ref 15–500)
Eosinophils Relative: 2.1 %
HCT: 40.3 % (ref 35.0–45.0)
Hemoglobin: 13.4 g/dL (ref 11.7–15.5)
Lymphs Abs: 2732 cells/uL (ref 850–3900)
MCH: 31.1 pg (ref 27.0–33.0)
MCHC: 33.3 g/dL (ref 32.0–36.0)
MCV: 93.5 fL (ref 80.0–100.0)
MPV: 8.8 fL (ref 7.5–12.5)
Monocytes Relative: 11.2 %
Neutro Abs: 2957 cells/uL (ref 1500–7800)
Neutrophils Relative %: 44.8 %
Platelets: 283 10*3/uL (ref 140–400)
RBC: 4.31 10*6/uL (ref 3.80–5.10)
RDW: 12.5 % (ref 11.0–15.0)
Total Lymphocyte: 41.4 %
WBC: 6.6 10*3/uL (ref 3.8–10.8)

## 2022-05-22 LAB — LIPID PANEL
Cholesterol: 181 mg/dL (ref <200)
HDL: 54 mg/dL (ref >=50)
LDL Cholesterol (Calc): 102 mg/dL (calc) — ABNORMAL HIGH
Non-HDL Cholesterol (Calc): 127 mg/dL (calc) (ref <130)
Total CHOL/HDL Ratio: 3.4 (calc) (ref <5.0)
Triglycerides: 149 mg/dL (ref <150)

## 2022-05-22 LAB — TSH: TSH: 1.5 mIU/L (ref 0.40–4.50)

## 2022-05-24 ENCOUNTER — Ambulatory Visit (INDEPENDENT_AMBULATORY_CARE_PROVIDER_SITE_OTHER): Payer: PPO | Admitting: Internal Medicine

## 2022-05-24 ENCOUNTER — Encounter: Payer: Self-pay | Admitting: Internal Medicine

## 2022-05-24 VITALS — BP 122/70 | HR 75 | Temp 98.4°F | Ht 66.75 in | Wt 201.5 lb

## 2022-05-24 DIAGNOSIS — Z0000 Encounter for general adult medical examination without abnormal findings: Secondary | ICD-10-CM | POA: Diagnosis not present

## 2022-05-24 DIAGNOSIS — R053 Chronic cough: Secondary | ICD-10-CM | POA: Diagnosis not present

## 2022-05-24 DIAGNOSIS — M545 Low back pain, unspecified: Secondary | ICD-10-CM | POA: Diagnosis not present

## 2022-05-24 DIAGNOSIS — Z85820 Personal history of malignant melanoma of skin: Secondary | ICD-10-CM

## 2022-05-24 DIAGNOSIS — E7800 Pure hypercholesterolemia, unspecified: Secondary | ICD-10-CM | POA: Diagnosis not present

## 2022-05-24 DIAGNOSIS — Z8616 Personal history of COVID-19: Secondary | ICD-10-CM | POA: Diagnosis not present

## 2022-05-24 DIAGNOSIS — K219 Gastro-esophageal reflux disease without esophagitis: Secondary | ICD-10-CM

## 2022-05-24 DIAGNOSIS — G8929 Other chronic pain: Secondary | ICD-10-CM

## 2022-05-24 DIAGNOSIS — Z6831 Body mass index (BMI) 31.0-31.9, adult: Secondary | ICD-10-CM

## 2022-05-24 LAB — POCT URINALYSIS DIPSTICK
Bilirubin, UA: NEGATIVE
Blood, UA: NEGATIVE
Glucose, UA: NEGATIVE
Ketones, UA: NEGATIVE
Leukocytes, UA: NEGATIVE
Nitrite, UA: NEGATIVE
Protein, UA: NEGATIVE
Spec Grav, UA: 1.015 (ref 1.010–1.025)
Urobilinogen, UA: 0.2 E.U./dL
pH, UA: 6.5 (ref 5.0–8.0)

## 2022-05-24 MED ORDER — AZITHROMYCIN 250 MG PO TABS: ORAL_TABLET | ORAL | 0 refills | Status: AC

## 2022-05-24 MED ORDER — HYDROCODONE BIT-HOMATROP MBR 5-1.5 MG/5ML PO SOLN: 5.0000 mL | Freq: Three times a day (TID) | ORAL | 0 refills | Status: DC | PRN

## 2022-05-24 NOTE — Progress Notes (Signed)
Annual Wellness Visit     Patient: Natalie Parker, Female    DOB: 1951/06/14, 71 y.o.   MRN: [id number] Visit Date: 05/24/2022  Chief Complaint  Patient presents with   Medicare Wellness   Annual Exam   Subjective    Natalie Parker is a 71 y.o. female who presents today for her Annual Wellness Visit.  HPI She is also seen for health maintenance exam and evaluation of medical issues.  Was seen for video visit June 15 with COVID-19 virus infection.  She still has phlegmy cough and congestion.  She would like something for this.  Have prescribed Zithromax Z-PAK and Hycodan cough syrup.  Husband had few symptoms.  Have discussed COVID booster in the Fall.  Records do not indicate that she has had Shingrix vaccine.  She needs to have this done at a later time.  Also is a candidate for pneumococcal 20 vaccine at a later time as well.  Needs tetanus immunization update as well.  This can be done at pharmacy since she is on Medicare.  History of primary osteoarthritis first MCP joint seen by Dr. Amedeo Plenty. History of chronic back pain seen by Dr. Nicholaus Bloom for pain management.  Had lumbar surgery in 2015 by Dr. Vertell Limber.  MRI in 2015 showed bulging disc and neuroforaminal involvement at L2-S1.  In June 2021 she had melanoma in situ removed by Dr. Renda Rolls, Dermatologist.  Had colonoscopy in 2015 at [facility] by Dr. Watt Climes.  Social history: Married.  Retired Theme park manager.  Used to swim for exercise but not as physically active.  Recently had COVID-19.  Family history: Father died at age 44 of a brain tumor.  2 brothers in good health.  Mother died at age 23 of unknown causes with history of psoriatic arthritis.  She has a history of osteopenia, spinal stenosis, insomnia.  History of hot flashes tried on Effexor but it caused a headache.  History of tonsillectomy 1974; right knee surgery 2010 with abrasion chondroplasty of the patella, lateral tibial plateau and medial femoral  epicondyles.  She had synovectomy of the suprapatellar pouch and medial joint right knee.      Patient Care Team: Elby Showers, MD as PCP - General (Internal Medicine)  Review of Systems   Objective    Vitals: BP 122/70   Pulse 75   Temp 98.4 F (36.9 C) (Tympanic)   Ht 5' 6.75" (1.695 m)   Wt 201 lb 8 oz (91.4 kg)   SpO2 98%   BMI 31.80 kg/m   Physical Exam   Most recent functional status assessment:    05/24/2022   11:02 AM  In your present state of health, do you have any difficulty performing the following activities:  Hearing? 0  Vision? 0  Difficulty concentrating or making decisions? 0  Walking or climbing stairs? 0  Dressing or bathing? 0  Doing errands, shopping? 0  Preparing Food and eating ? N  Using the Toilet? N  In the past six months, have you accidently leaked urine? N  Do you have problems with loss of bowel control? N  Managing your Medications? N  Managing your Finances? N  Housekeeping or managing your Housekeeping? N   Most recent fall risk assessment:    05/24/2022   11:01 AM  Fall Risk   Falls in the past year? 0  Number falls in past yr: 0  Injury with Fall? 0  Risk for fall due to : No  Fall Risks  Follow up Falls evaluation completed    Most recent depression screenings:    05/24/2022   11:01 AM 03/30/2021    2:16 PM  PHQ 2/9 Scores  PHQ - 2 Score 0 0   Most recent cognitive screening:    05/24/2022   11:02 AM  6CIT Screen  What Year? 0 points  What month? 0 points  What time? 0 points  Count back from 20 0 points  Months in reverse 0 points  Repeat phrase 0 points  Total Score 0 points       Assessment & Plan   History of recent COVID-19 virus infection.  Still has symptoms of cough and congestion and have prescribed Zithromax Z-PAK and Hycodan  History of chronic back pain and sees pain management physician, Dr. Hardin Negus for this.  Had lumbar back surgery by Dr. Vertell Limber in the remote past.  History of pure  hypercholesterolemia treated with low-dose Crestor 5 mg 3 times a week and lipids have improved from 1 year ago.  LDL is now 102 and previously was 124.  Total cholesterol improved from 215 a year ago to 181.  HDL was 54 and triglycerides are 149.  History of melanoma in situ seen by Dermatologist.  Last encounter with dermatologist November 2022.  GE reflux treated with PPI-Nexium in the past but this is not a current medication on file  BMI 31.80-needs to lose weight  Plan: Need for pneumococcal 20 vaccine in the near future as well as flu vaccine in the Fall.  Suggest COVID-vaccine in 3 months with new variant coming.  Also has not had Shingrix vaccine series.  May have tetanus update done at pharmacy.  It is now due.      Annual wellness visit done today including the all of the following: Reviewed patient's Family Medical History Reviewed and updated list of patient's medical providers Assessment of cognitive impairment was done Assessed patient's functional ability Established a written schedule for health screening [place] Completed and Reviewed  Discussed health benefits of physical activity, and encouraged her to engage in regular exercise appropriate for her age and condition.        IElby Showers, MD, have reviewed all documentation for this visit. The documentation on 05/24/22 for the exam, diagnosis, procedures, and orders are all accurate and complete.   Angus Seller, CMA

## 2022-05-24 NOTE — Progress Notes (Deleted)
   Subjective:    Patient ID: Natalie Parker, female    DOB: Apr 21, 1951, 71 y.o.   MRN: [id number]  HPI    Review of Systems     Objective:   Physical Exam        Assessment & Plan:

## 2022-06-20 NOTE — Patient Instructions (Addendum)
Patient is generally seen once yearly.  Labs reviewed.  Have prescribed Zithromax and Hycodan for persistent respiratory symptoms status post recent COVID-19 virus infection.  Continue low-dose Crestor 5 mg 3 times a week.  Have tetanus immunization at local pharmacy due to insurance reasons.  Continue annual melanoma check by dermatologist.  Work on diet exercise and weight loss.  Consider Pneumococcal 20 vaccine in the Fall as well as new COVID-vaccine in some 3 months.  Asked patient to call Dr. Watt Climes to see when next colonoscopy is due.

## 2022-07-08 ENCOUNTER — Other Ambulatory Visit: Payer: Self-pay | Admitting: Internal Medicine

## 2022-07-08 MED ORDER — ROSUVASTATIN CALCIUM 5 MG PO TABS: ORAL_TABLET | ORAL | 2 refills | Status: DC

## 2022-08-03 DIAGNOSIS — M47816 Spondylosis without myelopathy or radiculopathy, lumbar region: Secondary | ICD-10-CM | POA: Diagnosis not present

## 2022-08-03 DIAGNOSIS — G894 Chronic pain syndrome: Secondary | ICD-10-CM | POA: Diagnosis not present

## 2022-08-03 DIAGNOSIS — Z79891 Long term (current) use of opiate analgesic: Secondary | ICD-10-CM | POA: Diagnosis not present

## 2022-08-03 DIAGNOSIS — M150 Primary generalized (osteo)arthritis: Secondary | ICD-10-CM | POA: Diagnosis not present

## 2022-08-10 DIAGNOSIS — M545 Low back pain, unspecified: Secondary | ICD-10-CM | POA: Diagnosis not present

## 2022-08-10 DIAGNOSIS — M47816 Spondylosis without myelopathy or radiculopathy, lumbar region: Secondary | ICD-10-CM | POA: Diagnosis not present

## 2022-08-17 DIAGNOSIS — M545 Low back pain, unspecified: Secondary | ICD-10-CM | POA: Diagnosis not present

## 2022-08-17 DIAGNOSIS — M47816 Spondylosis without myelopathy or radiculopathy, lumbar region: Secondary | ICD-10-CM | POA: Diagnosis not present

## 2022-09-28 DIAGNOSIS — M47816 Spondylosis without myelopathy or radiculopathy, lumbar region: Secondary | ICD-10-CM | POA: Diagnosis not present

## 2022-09-28 DIAGNOSIS — G894 Chronic pain syndrome: Secondary | ICD-10-CM | POA: Diagnosis not present

## 2022-09-28 DIAGNOSIS — Z79891 Long term (current) use of opiate analgesic: Secondary | ICD-10-CM | POA: Diagnosis not present

## 2022-09-28 DIAGNOSIS — M150 Primary generalized (osteo)arthritis: Secondary | ICD-10-CM | POA: Diagnosis not present

## 2022-12-02 ENCOUNTER — Other Ambulatory Visit: Payer: PPO

## 2022-12-02 DIAGNOSIS — G8929 Other chronic pain: Secondary | ICD-10-CM | POA: Diagnosis not present

## 2022-12-02 DIAGNOSIS — E7800 Pure hypercholesterolemia, unspecified: Secondary | ICD-10-CM

## 2022-12-02 DIAGNOSIS — N3000 Acute cystitis without hematuria: Secondary | ICD-10-CM

## 2022-12-02 DIAGNOSIS — M4800 Spinal stenosis, site unspecified: Secondary | ICD-10-CM

## 2022-12-02 DIAGNOSIS — M545 Low back pain, unspecified: Secondary | ICD-10-CM | POA: Diagnosis not present

## 2022-12-02 LAB — LIPID PANEL
Cholesterol: 184 mg/dL (ref <200)
HDL: 73 mg/dL (ref >=50)
LDL Cholesterol (Calc): 92 mg/dL (calc)
Non-HDL Cholesterol (Calc): 111 mg/dL (calc) (ref <130)
Total CHOL/HDL Ratio: 2.5 (calc) (ref <5.0)
Triglycerides: 93 mg/dL (ref <150)

## 2022-12-02 LAB — HEPATIC FUNCTION PANEL
AG Ratio: 2 (calc) (ref 1.0–2.5)
ALT: 21 U/L (ref 6–29)
AST: 23 U/L (ref 10–35)
Albumin: 4.7 g/dL (ref 3.6–5.1)
Alkaline phosphatase (APISO): 48 U/L (ref 37–153)
Bilirubin, Direct: 0.1 mg/dL (ref 0.0–0.2)
Globulin: 2.4 g/dL (calc) (ref 1.9–3.7)
Indirect Bilirubin: 0.3 mg/dL (calc) (ref 0.2–1.2)
Total Bilirubin: 0.4 mg/dL (ref 0.2–1.2)
Total Protein: 7.1 g/dL (ref 6.1–8.1)

## 2022-12-02 NOTE — Addendum Note (Signed)
Addended by: Geradine Girt D on: 12/02/2022 10:34 AM   Modules accepted: Orders

## 2022-12-03 ENCOUNTER — Ambulatory Visit (INDEPENDENT_AMBULATORY_CARE_PROVIDER_SITE_OTHER): Payer: PPO | Admitting: Internal Medicine

## 2022-12-03 ENCOUNTER — Encounter: Payer: Self-pay | Admitting: Internal Medicine

## 2022-12-03 VITALS — BP 122/80 | HR 74 | Temp 98.0°F | Ht 66.75 in | Wt 202.0 lb

## 2022-12-03 DIAGNOSIS — M25552 Pain in left hip: Secondary | ICD-10-CM

## 2022-12-03 LAB — CBC WITH DIFFERENTIAL/PLATELET
Absolute Monocytes: 602 cells/uL (ref 200–950)
Basophils Absolute: 30 cells/uL (ref 0–200)
Basophils Relative: 0.5 %
Eosinophils Absolute: 83 cells/uL (ref 15–500)
Eosinophils Relative: 1.4 %
HCT: 40.8 % (ref 35.0–45.0)
Hemoglobin: 13.9 g/dL (ref 11.7–15.5)
Lymphs Abs: 2578 cells/uL (ref 850–3900)
MCH: 32.3 pg (ref 27.0–33.0)
MCHC: 34.1 g/dL (ref 32.0–36.0)
MCV: 94.9 fL (ref 80.0–100.0)
MPV: 9 fL (ref 7.5–12.5)
Monocytes Relative: 10.2 %
Neutro Abs: 2608 cells/uL (ref 1500–7800)
Neutrophils Relative %: 44.2 %
Platelets: 283 10*3/uL (ref 140–400)
RBC: 4.3 10*6/uL (ref 3.80–5.10)
RDW: 12.2 % (ref 11.0–15.0)
Total Lymphocyte: 43.7 %
WBC: 5.9 10*3/uL (ref 3.8–10.8)

## 2022-12-03 LAB — C-REACTIVE PROTEIN: CRP: 1.7 mg/L (ref <8.0)

## 2022-12-03 LAB — SEDIMENTATION RATE: Sed Rate: 11 mm/h (ref 0–30)

## 2022-12-03 NOTE — Progress Notes (Signed)
   Subjective:    Patient ID: Natalie Parker, female    DOB: December 09, 1950, 72 y.o.   MRN: [id number]  HPI  72 year old Female seen for 76-monthfollow-up.  Was seen in June 2023 for Medicare wellness visit and health maintenance exam.  At that time and had recent issue with COVID-19 virus infection.  She has chronic back pain treated by Dr. MNicholaus Bloomfor chronic pain management.  Had lumbar surgery in 2015 by Dr. SVertell Limberand MRI at that time showed bulging disc and neuroforaminal involvement L2-through S1.  She used to swim for exercise but is not as physically active.  Colonoscopy due April 2025.  Is due for tetanus update.  Suggest pneumococcal 20 vaccine.  Do not see that she has had Shingrix series.  I records indicate last COVID-vaccine was 2022.  She had flu vaccine October 2023.  Has not had RSV vaccine.  Recent sed rate normal at 11, CBC with differential normal, lipid and liver panel normal.  C-reactive protein normal at 1.7.  Review of Systems has had left sided  low back pain and hip pain.     Objective:   Physical Exam Blood pressure 122/80, pulse 74, temperature 98 degrees by ear thermometer, pulse oximetry 99%, weight 202 pounds, BMI 31.88  Skin: Warm and dry.  No cervical adenopathy.  Neck supple.  No thyromegaly.  No carotid bruits.  Chest clear.  Cardiac exam: Regular rate and rhythm without ectopy.  Straight leg raising is negative at 90 degrees.  Some pain with external rotation of right hip.  Muscle strength is normal in the lower extremities and deep tendon reflexes 2+ and symmetrical in the knees.     Assessment & Plan:  Left hip pain-suspect osteoarthritis.  Refer to Dr. CZollie Beckers  Handicap parking permit signed as patient requested.  Patient's pain management physician suggested CBC with differential C-reactive protein and sed rate be drawn with complaint of hip pain.  These were drawn today.  Results are normal.  She is on chronic hydrocodone therapy for  musculoskeletal pain as well and has Flexeril  Hyperlipidemia-lipid panel and liver functions are normal on low-dose rosuvastatin 5 mg daily  Plan: Medicare wellness visit health maintenance exam due in 6 months.  Continue current medications.

## 2022-12-03 NOTE — Patient Instructions (Addendum)
It was a pleasure to see you. Keep up water exercise. Continue statin. Labs are normal. Referral to Dr. Zollie Beckers for left hip pain.handicapped parking permit for hip and back pain.

## 2022-12-08 DIAGNOSIS — M150 Primary generalized (osteo)arthritis: Secondary | ICD-10-CM | POA: Diagnosis not present

## 2022-12-08 DIAGNOSIS — Z79891 Long term (current) use of opiate analgesic: Secondary | ICD-10-CM | POA: Diagnosis not present

## 2022-12-08 DIAGNOSIS — G894 Chronic pain syndrome: Secondary | ICD-10-CM | POA: Diagnosis not present

## 2022-12-08 DIAGNOSIS — M47816 Spondylosis without myelopathy or radiculopathy, lumbar region: Secondary | ICD-10-CM | POA: Diagnosis not present

## 2022-12-14 DIAGNOSIS — Z0289 Encounter for other administrative examinations: Secondary | ICD-10-CM

## 2022-12-27 ENCOUNTER — Ambulatory Visit: Payer: PPO | Admitting: Orthopaedic Surgery

## 2023-02-10 ENCOUNTER — Other Ambulatory Visit: Payer: Self-pay | Admitting: Internal Medicine

## 2023-02-10 DIAGNOSIS — Z1231 Encounter for screening mammogram for malignant neoplasm of breast: Secondary | ICD-10-CM

## 2023-02-22 DIAGNOSIS — H33321 Round hole, right eye: Secondary | ICD-10-CM | POA: Diagnosis not present

## 2023-02-22 DIAGNOSIS — H2513 Age-related nuclear cataract, bilateral: Secondary | ICD-10-CM | POA: Diagnosis not present

## 2023-02-28 DIAGNOSIS — H43391 Other vitreous opacities, right eye: Secondary | ICD-10-CM | POA: Diagnosis not present

## 2023-02-28 DIAGNOSIS — H2513 Age-related nuclear cataract, bilateral: Secondary | ICD-10-CM | POA: Diagnosis not present

## 2023-02-28 DIAGNOSIS — H33321 Round hole, right eye: Secondary | ICD-10-CM | POA: Diagnosis not present

## 2023-02-28 DIAGNOSIS — H35372 Puckering of macula, left eye: Secondary | ICD-10-CM | POA: Diagnosis not present

## 2023-02-28 DIAGNOSIS — H353132 Nonexudative age-related macular degeneration, bilateral, intermediate dry stage: Secondary | ICD-10-CM | POA: Diagnosis not present

## 2023-03-01 ENCOUNTER — Encounter: Payer: Self-pay | Admitting: Internal Medicine

## 2023-03-01 DIAGNOSIS — G894 Chronic pain syndrome: Secondary | ICD-10-CM | POA: Diagnosis not present

## 2023-03-01 DIAGNOSIS — Z79891 Long term (current) use of opiate analgesic: Secondary | ICD-10-CM | POA: Diagnosis not present

## 2023-03-01 DIAGNOSIS — M47816 Spondylosis without myelopathy or radiculopathy, lumbar region: Secondary | ICD-10-CM | POA: Diagnosis not present

## 2023-03-01 DIAGNOSIS — M150 Primary generalized (osteo)arthritis: Secondary | ICD-10-CM | POA: Diagnosis not present

## 2023-03-22 DIAGNOSIS — H35372 Puckering of macula, left eye: Secondary | ICD-10-CM | POA: Diagnosis not present

## 2023-03-22 DIAGNOSIS — H2513 Age-related nuclear cataract, bilateral: Secondary | ICD-10-CM | POA: Diagnosis not present

## 2023-03-22 DIAGNOSIS — H31091 Other chorioretinal scars, right eye: Secondary | ICD-10-CM | POA: Diagnosis not present

## 2023-03-22 DIAGNOSIS — H353132 Nonexudative age-related macular degeneration, bilateral, intermediate dry stage: Secondary | ICD-10-CM | POA: Diagnosis not present

## 2023-03-22 DIAGNOSIS — H43391 Other vitreous opacities, right eye: Secondary | ICD-10-CM | POA: Diagnosis not present

## 2023-03-29 ENCOUNTER — Ambulatory Visit
Admission: RE | Admit: 2023-03-29 | Discharge: 2023-03-29 | Disposition: A | Discharge status: Home / Self Care | Payer: PPO | Source: Ambulatory Visit | Attending: Internal Medicine | Admitting: Internal Medicine

## 2023-03-29 DIAGNOSIS — Z1231 Encounter for screening mammogram for malignant neoplasm of breast: Secondary | ICD-10-CM | POA: Diagnosis not present

## 2023-04-05 ENCOUNTER — Other Ambulatory Visit: Payer: Self-pay | Admitting: Internal Medicine

## 2023-04-21 DIAGNOSIS — D1801 Hemangioma of skin and subcutaneous tissue: Secondary | ICD-10-CM | POA: Diagnosis not present

## 2023-04-21 DIAGNOSIS — D229 Melanocytic nevi, unspecified: Secondary | ICD-10-CM | POA: Diagnosis not present

## 2023-04-21 DIAGNOSIS — Z86006 Personal history of melanoma in-situ: Secondary | ICD-10-CM | POA: Diagnosis not present

## 2023-04-21 DIAGNOSIS — L814 Other melanin hyperpigmentation: Secondary | ICD-10-CM | POA: Diagnosis not present

## 2023-04-21 DIAGNOSIS — L578 Other skin changes due to chronic exposure to nonionizing radiation: Secondary | ICD-10-CM | POA: Diagnosis not present

## 2023-04-21 DIAGNOSIS — D485 Neoplasm of uncertain behavior of skin: Secondary | ICD-10-CM | POA: Diagnosis not present

## 2023-04-21 DIAGNOSIS — D2272 Melanocytic nevi of left lower limb, including hip: Secondary | ICD-10-CM | POA: Diagnosis not present

## 2023-04-21 DIAGNOSIS — L720 Epidermal cyst: Secondary | ICD-10-CM | POA: Diagnosis not present

## 2023-04-21 DIAGNOSIS — L738 Other specified follicular disorders: Secondary | ICD-10-CM | POA: Diagnosis not present

## 2023-04-21 DIAGNOSIS — L821 Other seborrheic keratosis: Secondary | ICD-10-CM | POA: Diagnosis not present

## 2023-04-21 DIAGNOSIS — L570 Actinic keratosis: Secondary | ICD-10-CM | POA: Diagnosis not present

## 2023-05-03 DIAGNOSIS — G894 Chronic pain syndrome: Secondary | ICD-10-CM | POA: Diagnosis not present

## 2023-05-03 DIAGNOSIS — Z79891 Long term (current) use of opiate analgesic: Secondary | ICD-10-CM | POA: Diagnosis not present

## 2023-05-03 DIAGNOSIS — M47816 Spondylosis without myelopathy or radiculopathy, lumbar region: Secondary | ICD-10-CM | POA: Diagnosis not present

## 2023-05-03 DIAGNOSIS — M150 Primary generalized (osteo)arthritis: Secondary | ICD-10-CM | POA: Diagnosis not present

## 2023-05-17 DIAGNOSIS — H18413 Arcus senilis, bilateral: Secondary | ICD-10-CM | POA: Diagnosis not present

## 2023-05-17 DIAGNOSIS — H2513 Age-related nuclear cataract, bilateral: Secondary | ICD-10-CM | POA: Diagnosis not present

## 2023-05-17 DIAGNOSIS — H25043 Posterior subcapsular polar age-related cataract, bilateral: Secondary | ICD-10-CM | POA: Diagnosis not present

## 2023-05-17 DIAGNOSIS — H353132 Nonexudative age-related macular degeneration, bilateral, intermediate dry stage: Secondary | ICD-10-CM | POA: Diagnosis not present

## 2023-05-17 DIAGNOSIS — H35372 Puckering of macula, left eye: Secondary | ICD-10-CM | POA: Diagnosis not present

## 2023-05-17 DIAGNOSIS — H25013 Cortical age-related cataract, bilateral: Secondary | ICD-10-CM | POA: Diagnosis not present

## 2023-05-17 DIAGNOSIS — H2512 Age-related nuclear cataract, left eye: Secondary | ICD-10-CM | POA: Diagnosis not present

## 2023-05-27 NOTE — Progress Notes (Signed)
Annual Wellness Visit    Patient Care Team: Margaree Mackintosh, MD as PCP - General (Internal Medicine)  Visit Date: 05/27/23     Subjective:   Patient: Natalie Parker, Female    DOB: Jul 24, 1951, 72 y.o.   MRN: [id number]  Natalie Parker is a 72 y.o. Female who presents today for her Annual Wellness Visit.  History of depression  History of osteopenia   History of restless leg syndrome treated with cyclobenzaprine 10 mg at bedtime.  History of hyperlipidemia treated with rosuvastatin 5 mg three times weekly with supper.  Was seen for video visit June 15 with COVID-19 virus infection.  She still has phlegmy cough and congestion.  She would like something for this.  Have prescribed Zithromax Z-PAK and Hycodan cough syrup.  Husband had few symptoms.  Have discussed COVID booster in the Fall.  Records do not indicate that she has had Shingrix vaccine.  She needs to have this done at a later time.  Also is a candidate for pneumococcal 20 vaccine at a later time as well.  Needs tetanus immunization update as well.  This can be done at pharmacy since she is on Medicare.  History of primary osteoarthritis first MCP joint seen by Dr. Amanda Pea. History of chronic back pain seen by Dr. Thyra Breed for pain management.  Had lumbar surgery in 2015 by Dr. Venetia Maxon.  MRI in 2015 showed bulging disc and neuroforaminal involvement at L2-S1.  In June 2021 she had melanoma in situ removed by Dr. Sharyn Lull, Dermatologist.  She has a history of osteopenia, spinal stenosis, insomnia.  History of hot flashes tried on Effexor but it caused a headache.  History of tonsillectomy 1974; right knee surgery 2010 with abrasion chondroplasty of the patella, lateral tibial plateau and medial femoral epicondyles.  She had synovectomy of the suprapatellar pouch and medial joint right knee.   Mammogram last completed 03/30/23. No mammographic evidence of malignancy. Recommended repeat in 2025.  Had colonoscopy in  2015 at [facility] by Dr. Ewing Schlein.  Social history: Married.  Retired Interior and spatial designer.  Used to swim for exercise but not as physically active.  Recently had COVID-19.  Family history: Father died at age 49 of a brain tumor.  2 brothers in good health.  Mother died at age 84 of unknown causes with history of psoriatic arthritis.   Vaccine counseling: due for tetanus vaccine. UTD on flu vaccine.  Past Medical History:  Diagnosis Date   Depression    Osteopenia    Spinal stenosis      Family History  Problem Relation Age of Onset   Other Mother    Brain cancer Father    Healthy Brother    Healthy Brother      Social History   Social History Narrative   Not on file     ROS    Objective:   Vitals: There were no vitals taken for this visit.  Physical Exam   Most recent functional status assessment:     No data to display         Most recent fall risk assessment:    12/03/2022   11:22 AM  Fall Risk   Falls in the past year? 0  Number falls in past yr: 0  Injury with Fall? 0  Risk for fall due to : No Fall Risks  Follow up Falls prevention discussed    Most recent depression screenings:    12/03/2022   11:22 AM  05/24/2022   11:01 AM  PHQ 2/9 Scores  PHQ - 2 Score 0 0   Most recent cognitive screening:    05/24/2022   11:02 AM  6CIT Screen  What Year? 0 points  What month? 0 points  What time? 0 points  Count back from 20 0 points  Months in reverse 0 points  Repeat phrase 0 points  Total Score 0 points     Results:   Studies obtained and personally reviewed by me:  Imaging, colonoscopy, mammogram, bone density scan, echocardiogram, heart cath, stress test, CT calcium score, etc.    Labs:       Component Value Date/Time   NA 141 05/21/2022 1008   K 4.6 05/21/2022 1008   CL 105 05/21/2022 1008   CO2 29 05/21/2022 1008   GLUCOSE 82 05/21/2022 1008   BUN 21 05/21/2022 1008   CREATININE 0.62 05/21/2022 1008   CALCIUM 9.3 05/21/2022 1008    PROT 7.1 12/02/2022 1024   ALBUMIN 4.3 12/31/2016 1043   AST 23 12/02/2022 1024   ALT 21 12/02/2022 1024   ALKPHOS 41 12/31/2016 1043   BILITOT 0.4 12/02/2022 1024   GFRNONAA 92 03/26/2021 0931   GFRAA 106 03/26/2021 0931     Lab Results  Component Value Date   WBC 5.9 12/02/2022   HGB 13.9 12/02/2022   HCT 40.8 12/02/2022   MCV 94.9 12/02/2022   PLT 283 12/02/2022    Lab Results  Component Value Date   CHOL 184 12/02/2022   HDL 73 12/02/2022   LDLCALC 92 12/02/2022   TRIG 93 12/02/2022   CHOLHDL 2.5 12/02/2022    No results found for: "HGBA1C"   Lab Results  Component Value Date   TSH 1.50 05/21/2022         Assessment & Plan:         Annual wellness visit done today including the all of the following: Reviewed patient's Family Medical History Reviewed and updated list of patient's medical providers Assessment of cognitive impairment was done Assessed patient's functional ability Established a written schedule for health screening services Health Risk Assessent Completed and Reviewed  Discussed health benefits of physical activity, and encouraged her to engage in regular exercise appropriate for her age and condition.        I,Alexander Ruley,acting as a Neurosurgeon for Margaree Mackintosh, MD.,have documented all relevant documentation on the behalf of Margaree Mackintosh, MD,as directed by  Margaree Mackintosh, MD while in the presence of Margaree Mackintosh, MD.

## 2023-06-07 ENCOUNTER — Other Ambulatory Visit: Payer: PPO

## 2023-06-07 DIAGNOSIS — Z1329 Encounter for screening for other suspected endocrine disorder: Secondary | ICD-10-CM | POA: Diagnosis not present

## 2023-06-07 DIAGNOSIS — E7800 Pure hypercholesterolemia, unspecified: Secondary | ICD-10-CM

## 2023-06-07 DIAGNOSIS — G4700 Insomnia, unspecified: Secondary | ICD-10-CM

## 2023-06-07 DIAGNOSIS — Z0000 Encounter for general adult medical examination without abnormal findings: Secondary | ICD-10-CM | POA: Diagnosis not present

## 2023-06-07 LAB — CBC WITH DIFFERENTIAL/PLATELET
Basophils Absolute: 30 cells/uL (ref 0–200)
Eosinophils Absolute: 142 cells/uL (ref 15–500)
HCT: 39.9 % (ref 35.0–45.0)
Lymphs Abs: 2561 cells/uL (ref 850–3900)
MCH: 32.1 pg (ref 27.0–33.0)
MCV: 95 fL (ref 80.0–100.0)
MPV: 9.1 fL (ref 7.5–12.5)
Neutrophils Relative %: 44 %
Platelets: 252 10*3/uL (ref 140–400)
Total Lymphocyte: 43.4 %

## 2023-06-08 LAB — CBC WITH DIFFERENTIAL/PLATELET
Absolute Monocytes: 572 cells/uL (ref 200–950)
Basophils Relative: 0.5 %
Eosinophils Relative: 2.4 %
Hemoglobin: 13.5 g/dL (ref 11.7–15.5)
MCHC: 33.8 g/dL (ref 32.0–36.0)
Monocytes Relative: 9.7 %
Neutro Abs: 2596 cells/uL (ref 1500–7800)
RBC: 4.2 10*6/uL (ref 3.80–5.10)
RDW: 12.1 % (ref 11.0–15.0)
WBC: 5.9 10*3/uL (ref 3.8–10.8)

## 2023-06-08 LAB — COMPLETE METABOLIC PANEL WITH GFR
AG Ratio: 1.8 (calc) (ref 1.0–2.5)
ALT: 19 U/L (ref 6–29)
AST: 23 U/L (ref 10–35)
Albumin: 4.4 g/dL (ref 3.6–5.1)
Alkaline phosphatase (APISO): 50 U/L (ref 37–153)
BUN: 24 mg/dL (ref 7–25)
CO2: 30 mmol/L (ref 20–32)
Calcium: 9.6 mg/dL (ref 8.6–10.4)
Chloride: 105 mmol/L (ref 98–110)
Creat: 0.6 mg/dL (ref 0.60–1.00)
Globulin: 2.5 g/dL (calc) (ref 1.9–3.7)
Glucose, Bld: 90 mg/dL (ref 65–99)
Potassium: 4.5 mmol/L (ref 3.5–5.3)
Sodium: 141 mmol/L (ref 135–146)
Total Bilirubin: 0.3 mg/dL (ref 0.2–1.2)
Total Protein: 6.9 g/dL (ref 6.1–8.1)
eGFR: 97 mL/min/{1.73_m2} (ref >=60)

## 2023-06-08 LAB — LIPID PANEL
Cholesterol: 156 mg/dL (ref <200)
HDL: 65 mg/dL (ref >=50)
LDL Cholesterol (Calc): 78 mg/dL (calc)
Non-HDL Cholesterol (Calc): 91 mg/dL (calc) (ref <130)
Total CHOL/HDL Ratio: 2.4 (calc) (ref <5.0)
Triglycerides: 58 mg/dL (ref <150)

## 2023-06-08 LAB — TSH: TSH: 1.74 mIU/L (ref 0.40–4.50)

## 2023-06-10 ENCOUNTER — Ambulatory Visit
Admission: RE | Admit: 2023-06-10 | Discharge: 2023-06-10 | Disposition: A | Discharge status: Home / Self Care | Payer: PPO | Source: Ambulatory Visit | Attending: Internal Medicine | Admitting: Internal Medicine

## 2023-06-10 ENCOUNTER — Ambulatory Visit (INDEPENDENT_AMBULATORY_CARE_PROVIDER_SITE_OTHER): Payer: PPO | Admitting: Internal Medicine

## 2023-06-10 ENCOUNTER — Encounter: Payer: Self-pay | Admitting: Internal Medicine

## 2023-06-10 VITALS — BP 128/82 | HR 83 | Resp 16 | Ht 66.75 in | Wt 199.0 lb

## 2023-06-10 DIAGNOSIS — M25552 Pain in left hip: Secondary | ICD-10-CM

## 2023-06-10 DIAGNOSIS — M8580 Other specified disorders of bone density and structure, unspecified site: Secondary | ICD-10-CM | POA: Diagnosis not present

## 2023-06-10 DIAGNOSIS — Z85820 Personal history of malignant melanoma of skin: Secondary | ICD-10-CM | POA: Diagnosis not present

## 2023-06-10 DIAGNOSIS — N6459 Other signs and symptoms in breast: Secondary | ICD-10-CM | POA: Diagnosis not present

## 2023-06-10 DIAGNOSIS — K219 Gastro-esophageal reflux disease without esophagitis: Secondary | ICD-10-CM

## 2023-06-10 DIAGNOSIS — Z23 Encounter for immunization: Secondary | ICD-10-CM | POA: Diagnosis not present

## 2023-06-10 DIAGNOSIS — Z0000 Encounter for general adult medical examination without abnormal findings: Secondary | ICD-10-CM

## 2023-06-10 DIAGNOSIS — E7800 Pure hypercholesterolemia, unspecified: Secondary | ICD-10-CM | POA: Diagnosis not present

## 2023-06-10 LAB — POCT URINALYSIS DIPSTICK
Bilirubin, UA: NEGATIVE
Blood, UA: NEGATIVE
Glucose, UA: NEGATIVE
Ketones, UA: NEGATIVE
Leukocytes, UA: NEGATIVE
Nitrite, UA: NEGATIVE
Protein, UA: NEGATIVE
Spec Grav, UA: 1.015 (ref 1.010–1.025)
Urobilinogen, UA: 0.2 E.U./dL
pH, UA: 5 (ref 5.0–8.0)

## 2023-06-10 NOTE — Patient Instructions (Signed)
  Natalie Parker , Thank you for taking time to come for your Medicare Wellness Visit. I appreciate your ongoing commitment to your health goals. Please review the following plan we discussed and let me know if I can assist you in the future.   These are the goals we discussed:  Goals   None     This is a list of the screening recommended for you and due dates:  Health Maintenance  Topic Date Due   DTaP/Tdap/Td vaccine (3 - Td or Tdap) 09/20/2021   COVID-19 Vaccine (6 - 2023-24 season) 06/26/2023*   Zoster (Shingles) Vaccine (1 of 2) 09/10/2023*   Flu Shot  06/30/2023   Colon Cancer Screening  03/25/2024   Mammogram  03/28/2024   Medicare Annual Wellness Visit  06/06/2024   Pneumonia Vaccine  Completed   DEXA scan (bone density measurement)  Completed   HPV Vaccine  Aged Out   Hepatitis C Screening  Discontinued  *Topic was postponed. The date shown is not the original due date.

## 2023-06-10 NOTE — Progress Notes (Addendum)
Annual Wellness Visit    Patient Care Team: Margaree Mackintosh, MD as PCP - General (Internal Medicine)  Visit Date: 06/10/23    Subjective:   Patient: Natalie Parker, Female    DOB: [birth year], 72 y.o.   MRN: [id number]  Natalie Parker is a 72 y.o. Female who presents today for her Annual Wellness Visit.  Today, she states she is feeling alright overall. However she has been struggling with left hip pain that comes and goes for at least the past 3 months. Her flare-ups of hip pain are random and seemingly unprovoked. She also wakes up in the mornings in pain. Her pain is relieved somewhat with working out in the pool. She believes her hip pain may be related to arthritis. Currently she is taking naproxen sodium for inflammation, without much improvement. She is not taking the meloxicam. She recently had a nerve block performed by Dr. Venia Carbon performed a nerve block which was ineffective.  Also complains of a lot of pain in her feet, she has been trying compression socks lately.  Recently she obtained CoQ10 to try for her heart and joint health. She has not yet started taking it as she was unsure if she could take with her fish oil. We discussed that it should be okay to take both supplements.  She is scheduled for cataract surgery on the 22nd.  History of depression.  History of restless leg syndrome treated with cyclobenzaprine 10 mg at bedtime.  History of hyperlipidemia treated with rosuvastatin 5 mg three times weekly with supper.  Was seen for video visit June 15 with COVID-19 virus infection.  She still has phlegmy cough and congestion.  She would like something for this.  Have prescribed Zithromax Z-PAK and Hycodan cough syrup.  Husband had few symptoms.  Have discussed COVID booster in the Fall.  Records do not indicate that she has had Shingrix vaccine.  She needs to have this done at a later time.  Also is a candidate for pneumococcal 20 vaccine at a later time as well.   Needs tetanus immunization update as well.  This can be done at pharmacy since she is on Medicare.  History of primary osteoarthritis first MCP joint seen by Dr. Amanda Pea. History of chronic back pain seen by Dr. Thyra Breed for pain management.  Had lumbar surgery in 2015 by Dr. Venetia Maxon.  MRI in 2015 showed bulging disc and neuroforaminal involvement at L2-S1.  In June 2021 she had melanoma in situ removed by Dr. Sharyn Lull, Dermatologist.  She has a history of osteopenia, spinal stenosis, insomnia.  History of hot flashes tried on Effexor but it caused a headache.  History of tonsillectomy 1974; right knee surgery 2010 with abrasion chondroplasty of the patella, lateral tibial plateau and medial femoral epicondyles.  She had synovectomy of the suprapatellar pouch and medial joint right knee.   Mammogram last completed 03/30/23. No mammographic evidence of malignancy. Recommended repeat in 2025.  Had colonoscopy in 2015 at [facility] by Dr. Ewing Schlein.  Social history: Married.  Retired Interior and spatial designer.  Used to swim for exercise but not as physically active.  Recently had COVID-19.  Family history: Father died at age 63 of a brain tumor.  2 brothers in good health.  Mother died at age 55 of unknown causes with history of psoriatic arthritis.   Past Medical History:  Diagnosis Date   Depression    Osteopenia    Spinal stenosis      Family History  Problem Relation Age of Onset   Other Mother    Brain cancer Father    Healthy Brother    Healthy Brother      Social History   Social History Narrative   Not on file     Review of Systems  Constitutional:  Negative for chills and fever.  HENT:  Negative for sore throat.   Respiratory:  Negative for cough and shortness of breath.   Cardiovascular:  Negative for chest pain and palpitations.  Gastrointestinal:  Negative for diarrhea, nausea and vomiting.  Genitourinary:  Negative for hematuria.  Musculoskeletal:  Positive for joint  pain (Left hip).  Skin:  Negative for rash.  Neurological:  Negative for headaches.      Objective:   Vitals: BP 128/82   Pulse 83   Resp 16   Ht 5' 6.75" (1.695 m)   Wt 199 lb (90.3 kg)   SpO2 98%   BMI 31.40 kg/m   Physical Exam Exam conducted with a chaperone present.  Constitutional:      Appearance: Normal appearance.  HENT:     Head: Normocephalic.     Right Ear: Tympanic membrane, ear canal and external ear normal. There is no impacted cerumen.     Left Ear: Tympanic membrane, ear canal and external ear normal. There is no impacted cerumen.     Mouth/Throat:     Mouth: Mucous membranes are moist.     Pharynx: Oropharynx is clear. No oropharyngeal exudate.  Eyes:     Extraocular Movements: Extraocular movements intact.     Pupils: Pupils are equal, round, and reactive to light.  Neck:     Thyroid: No thyromegaly.     Vascular: No carotid bruit.  Cardiovascular:     Rate and Rhythm: Normal rate and regular rhythm. No extrasystoles are present.    Pulses: Normal pulses.     Heart sounds: Normal heart sounds. No murmur heard.    No gallop.     Comments: Trace LE swelling bilaterally. Pulmonary:     Effort: Pulmonary effort is normal. No respiratory distress.     Breath sounds: Normal breath sounds. No wheezing or rales.  Chest:     Comments: Small nodule of left breast at 9 o'clock laterally. Thickening noted at 12 o'clock of left breast. Abdominal:     General: Abdomen is flat. Bowel sounds are normal. There is no distension.     Palpations: There is no splenomegaly or mass.     Tenderness: There is no abdominal tenderness. There is no guarding.  Musculoskeletal:        General: Normal range of motion.     Cervical back: Normal range of motion and neck supple.     Right knee: Crepitus present.     Left knee: Crepitus present.     Right lower leg: Edema present.     Left lower leg: Edema present.  Lymphadenopathy:     Cervical: No cervical adenopathy.   Skin:    General: Skin is warm and dry.  Neurological:     General: No focal deficit present.     Mental Status: She is alert and oriented to person, place, and time.  Psychiatric:        Mood and Affect: Mood normal.        Behavior: Behavior normal.      Most recent functional status assessment:     No data to display         Most recent  fall risk assessment:    06/10/2023   11:05 AM  Fall Risk   Falls in the past year? 0  Number falls in past yr: 0  Injury with Fall? 0  Risk for fall due to : No Fall Risks  Follow up Falls evaluation completed;Education provided;Falls prevention discussed    Most recent depression screenings:    06/10/2023   11:02 AM 12/03/2022   11:22 AM  PHQ 2/9 Scores  PHQ - 2 Score 0 0   Most recent cognitive screening:    06/10/2023   11:08 AM  6CIT Screen  What Year? 0 points  What month? 0 points  What time? 0 points  Count back from 20 0 points  Months in reverse 0 points  Repeat phrase 0 points  Total Score 0 points     Results:   Studies obtained and personally reviewed by me:  Imaging, colonoscopy, mammogram, bone density scan, echocardiogram, heart cath, stress test, CT calcium score, etc.    Labs:       Component Value Date/Time   NA 141 06/07/2023 1004   K 4.5 06/07/2023 1004   CL 105 06/07/2023 1004   CO2 30 06/07/2023 1004   GLUCOSE 90 06/07/2023 1004   BUN 24 06/07/2023 1004   CREATININE 0.60 06/07/2023 1004   CALCIUM 9.6 06/07/2023 1004   PROT 6.9 06/07/2023 1004   ALBUMIN 4.3 12/31/2016 1043   AST 23 06/07/2023 1004   ALT 19 06/07/2023 1004   ALKPHOS 41 12/31/2016 1043   BILITOT 0.3 06/07/2023 1004   GFRNONAA 92 03/26/2021 0931   GFRAA 106 03/26/2021 0931     Lab Results  Component Value Date   WBC 5.9 06/07/2023   HGB 13.5 06/07/2023   HCT 39.9 06/07/2023   MCV 95.0 06/07/2023   PLT 252 06/07/2023    Lab Results  Component Value Date   CHOL 156 06/07/2023   HDL 65 06/07/2023   LDLCALC  78 06/07/2023   TRIG 58 06/07/2023   CHOLHDL 2.4 06/07/2023    No results found for: "HGBA1C"   Lab Results  Component Value Date   TSH 1.74 06/07/2023       Assessment & Plan:   Left hip pain: Left hip diagnostic x-ray is ordered today. Urinalysis completed today which is normal. May need to see orthopedist.  Nodule of left breast noted on physical exam: Order diagnostic mammogram of left breast.  Hyperlipidemia- continue statin medication. Lipid panel is WNL  Vaccine counseling: due for tetanus vaccine which she will obtain at her pharmacy. UTD on flu vaccine. Recommend consideration of Covid-19 booster this Fall. Discussed pneumococcal vaccine, which we will administer today.  RTC in 6 months.      Annual wellness visit done today including all of the following: Reviewed patient's Family Medical History Reviewed and updated list of patient's medical providers Assessment of cognitive impairment was done Assessed patient's functional ability Established a written schedule for health screening services Health Risk Assessent Completed and Reviewed  Discussed health benefits of physical activity, and encouraged her to engage in regular exercise appropriate for her age and condition.     I,Mathew Stumpf,acting as a Neurosurgeon for Margaree Mackintosh, MD.,have documented all relevant documentation on the behalf of Margaree Mackintosh, MD,as directed by  Margaree Mackintosh, MD while in the presence of Margaree Mackintosh, MD.  I, Margaree Mackintosh, MD, have reviewed all documentation for this visit. The documentation on 06/26/23 for the exam, diagnosis, procedures,  and orders are all accurate and complete.

## 2023-06-16 ENCOUNTER — Other Ambulatory Visit: Payer: Self-pay | Admitting: Internal Medicine

## 2023-06-16 DIAGNOSIS — N6459 Other signs and symptoms in breast: Secondary | ICD-10-CM

## 2023-06-20 DIAGNOSIS — H35372 Puckering of macula, left eye: Secondary | ICD-10-CM | POA: Diagnosis not present

## 2023-06-20 DIAGNOSIS — H2512 Age-related nuclear cataract, left eye: Secondary | ICD-10-CM | POA: Diagnosis not present

## 2023-06-20 DIAGNOSIS — H3581 Retinal edema: Secondary | ICD-10-CM | POA: Diagnosis not present

## 2023-06-26 ENCOUNTER — Encounter: Payer: Self-pay | Admitting: Internal Medicine

## 2023-06-27 ENCOUNTER — Ambulatory Visit
Admission: RE | Admit: 2023-06-27 | Discharge: 2023-06-27 | Disposition: A | Discharge status: Home / Self Care | Payer: PPO | Source: Ambulatory Visit | Attending: Internal Medicine | Admitting: Internal Medicine

## 2023-06-27 DIAGNOSIS — N6459 Other signs and symptoms in breast: Secondary | ICD-10-CM

## 2023-06-28 DIAGNOSIS — Z9889 Other specified postprocedural states: Secondary | ICD-10-CM | POA: Diagnosis not present

## 2023-06-28 DIAGNOSIS — H43811 Vitreous degeneration, right eye: Secondary | ICD-10-CM | POA: Diagnosis not present

## 2023-07-12 DIAGNOSIS — M47816 Spondylosis without myelopathy or radiculopathy, lumbar region: Secondary | ICD-10-CM | POA: Diagnosis not present

## 2023-07-12 DIAGNOSIS — M150 Primary generalized (osteo)arthritis: Secondary | ICD-10-CM | POA: Diagnosis not present

## 2023-07-12 DIAGNOSIS — Z79891 Long term (current) use of opiate analgesic: Secondary | ICD-10-CM | POA: Diagnosis not present

## 2023-07-12 DIAGNOSIS — G894 Chronic pain syndrome: Secondary | ICD-10-CM | POA: Diagnosis not present

## 2023-07-14 DIAGNOSIS — H2511 Age-related nuclear cataract, right eye: Secondary | ICD-10-CM | POA: Diagnosis not present

## 2023-07-18 DIAGNOSIS — H43391 Other vitreous opacities, right eye: Secondary | ICD-10-CM | POA: Diagnosis not present

## 2023-07-18 DIAGNOSIS — H2511 Age-related nuclear cataract, right eye: Secondary | ICD-10-CM | POA: Diagnosis not present

## 2023-07-26 DIAGNOSIS — H31091 Other chorioretinal scars, right eye: Secondary | ICD-10-CM | POA: Diagnosis not present

## 2023-07-26 DIAGNOSIS — Z9889 Other specified postprocedural states: Secondary | ICD-10-CM | POA: Diagnosis not present

## 2023-08-16 DIAGNOSIS — Z9889 Other specified postprocedural states: Secondary | ICD-10-CM | POA: Diagnosis not present

## 2023-08-16 DIAGNOSIS — H43812 Vitreous degeneration, left eye: Secondary | ICD-10-CM | POA: Diagnosis not present

## 2023-09-08 ENCOUNTER — Ambulatory Visit
Admission: RE | Admit: 2023-09-08 | Discharge: 2023-09-08 | Disposition: A | Discharge status: Home / Self Care | Payer: PPO | Source: Ambulatory Visit | Attending: Internal Medicine | Admitting: Internal Medicine

## 2023-09-08 DIAGNOSIS — E349 Endocrine disorder, unspecified: Secondary | ICD-10-CM | POA: Diagnosis not present

## 2023-09-08 DIAGNOSIS — M8580 Other specified disorders of bone density and structure, unspecified site: Secondary | ICD-10-CM

## 2023-09-08 DIAGNOSIS — R29890 Loss of height: Secondary | ICD-10-CM | POA: Diagnosis not present

## 2023-09-27 ENCOUNTER — Other Ambulatory Visit: Payer: Self-pay | Admitting: Internal Medicine

## 2023-10-11 DIAGNOSIS — Z79891 Long term (current) use of opiate analgesic: Secondary | ICD-10-CM | POA: Diagnosis not present

## 2023-10-11 DIAGNOSIS — M150 Primary generalized (osteo)arthritis: Secondary | ICD-10-CM | POA: Diagnosis not present

## 2023-10-11 DIAGNOSIS — M47816 Spondylosis without myelopathy or radiculopathy, lumbar region: Secondary | ICD-10-CM | POA: Diagnosis not present

## 2023-10-11 DIAGNOSIS — G894 Chronic pain syndrome: Secondary | ICD-10-CM | POA: Diagnosis not present

## 2023-12-13 ENCOUNTER — Other Ambulatory Visit: Payer: PPO

## 2023-12-13 DIAGNOSIS — E7800 Pure hypercholesterolemia, unspecified: Secondary | ICD-10-CM

## 2023-12-14 LAB — LIPID PANEL
Cholesterol: 147 mg/dL (ref <200)
HDL: 61 mg/dL (ref >=50)
LDL Cholesterol (Calc): 69 mg/dL
Non-HDL Cholesterol (Calc): 86 mg/dL (ref <130)
Total CHOL/HDL Ratio: 2.4 (calc) (ref <5.0)
Triglycerides: 88 mg/dL (ref <150)

## 2023-12-14 LAB — HEPATIC FUNCTION PANEL
AG Ratio: 2.3 (calc) (ref 1.0–2.5)
ALT: 18 U/L (ref 6–29)
AST: 22 U/L (ref 10–35)
Albumin: 4.6 g/dL (ref 3.6–5.1)
Alkaline phosphatase (APISO): 47 U/L (ref 37–153)
Bilirubin, Direct: 0.1 mg/dL (ref 0.0–0.2)
Globulin: 2 g/dL (ref 1.9–3.7)
Indirect Bilirubin: 0.2 mg/dL (ref 0.2–1.2)
Total Bilirubin: 0.3 mg/dL (ref 0.2–1.2)
Total Protein: 6.6 g/dL (ref 6.1–8.1)

## 2023-12-15 ENCOUNTER — Ambulatory Visit: Payer: PPO | Admitting: Internal Medicine

## 2023-12-15 ENCOUNTER — Encounter: Payer: Self-pay | Admitting: Internal Medicine

## 2023-12-15 VITALS — BP 110/70 | HR 73 | Ht 66.75 in | Wt 200.0 lb

## 2023-12-15 DIAGNOSIS — K219 Gastro-esophageal reflux disease without esophagitis: Secondary | ICD-10-CM | POA: Diagnosis not present

## 2023-12-15 DIAGNOSIS — E7800 Pure hypercholesterolemia, unspecified: Secondary | ICD-10-CM

## 2023-12-15 DIAGNOSIS — R1013 Epigastric pain: Secondary | ICD-10-CM

## 2023-12-15 LAB — CBC WITH DIFFERENTIAL/PLATELET
Absolute Lymphocytes: 2513 {cells}/uL (ref 850–3900)
Absolute Monocytes: 616 {cells}/uL (ref 200–950)
Basophils Absolute: 31 {cells}/uL (ref 0–200)
Basophils Relative: 0.5 %
Eosinophils Absolute: 128 {cells}/uL (ref 15–500)
Eosinophils Relative: 2.1 %
HCT: 40.6 % (ref 35.0–45.0)
Hemoglobin: 13.4 g/dL (ref 11.7–15.5)
MCH: 31.5 pg (ref 27.0–33.0)
MCHC: 33 g/dL (ref 32.0–36.0)
MCV: 95.3 fL (ref 80.0–100.0)
MPV: 9.2 fL (ref 7.5–12.5)
Monocytes Relative: 10.1 %
Neutro Abs: 2812 {cells}/uL (ref 1500–7800)
Neutrophils Relative %: 46.1 %
Platelets: 281 Thousand/uL (ref 140–400)
RBC: 4.26 Million/uL (ref 3.80–5.10)
RDW: 12.3 % (ref 11.0–15.0)
Total Lymphocyte: 41.2 %
WBC: 6.1 Thousand/uL (ref 3.8–10.8)

## 2023-12-15 LAB — POCT URINALYSIS DIP (CLINITEK)
Bilirubin, UA: NEGATIVE
Blood, UA: NEGATIVE
Glucose, UA: NEGATIVE mg/dL
Ketones, POC UA: NEGATIVE mg/dL
Leukocytes, UA: NEGATIVE
Nitrite, UA: NEGATIVE
POC PROTEIN,UA: NEGATIVE
Spec Grav, UA: 1.01 (ref 1.010–1.025)
Urobilinogen, UA: 0.2 U/dL
pH, UA: 7 (ref 5.0–8.0)

## 2023-12-15 MED ORDER — ONDANSETRON HCL 4 MG PO TABS: 4.0000 mg | ORAL_TABLET | Freq: Three times a day (TID) | ORAL | 0 refills | Status: AC | PRN

## 2023-12-15 NOTE — Progress Notes (Addendum)
Natalie Care Team: Margaree Mackintosh, MD as PCP - General (Internal Medicine)  Visit Date: 12/15/23  Subjective:   Chief Complaint  Natalie presents with   Medical Management of Chronic Issues   Hyperlipidemia   Natalie Parker,Female DOB:[birth year],72 y.o. EAV:[id number]   73 y.o. Female presents today for 6 months follow-up for Pure Hypercholesterolemia. Seen in July 2024 for her annual exam. At that time she had been having some intermittent left hip pain, which she believed to be contributed by her arthritis. Had had a nerve block performed by Dr. Vear Clock, which was ineffective. Some pain in her feet that she had been trying to relieve with compression stockings. Had been trying CoQ10 for heart and joint health. Cataract surgery on July 22nd.   Interim History Seen by Stephannie Li with Ophthalmology on 06/28/23 (Other specified postprocedural states; Vitreous degeneration, right eye), 07/26/23 (Other specified postprocedural states; Other chorioretinal scars, right eye), and 08/16/23 (Other specified postprocedural states; Vitreous degeneration, left eye).   Seen by Thyra Breed 07/12/23 (Primary generalized (osteo)arthritis; Spondylosis without myelopathy or radiculopathy, lumbar region; Long term (current) use of opiate analgesic; Chronic pain syndrome).   Seen by [facility] 07/14/23 (Age-related nuclear cataract, right eye).   Seen in [facility] 07/18/23 (Other vitreous opacities, right eye; Age-related nuclear cataract, right eye)  Today she reports that she's been having some nausea in the mornings. Her stools have been lighter and she's been having some straining constipation. Has been drinking a lot of water. Takes 2 stool softeners at night and a laxative (Ex-lax) when she feels constipated. Endorses low appetite, belching and indigestion even if she hasn't eaten, abdominal fullness like she has just eaten even if she hasn't, sweating but no elevated  temperature with thermometer check, and a slight headache. Notes that she has not gone out much recently. Denies abdominal soreness, fever/chills, vomiting, or urinary symptoms. Her last colonoscopy in April 2015 found external/internal hemorrhoids, diverticulosis in sigmoid/descending colon, and a small lipoma in the ascending colon but was otherwise normal. Will be due April 2025 for repeat.   She also mentions that her pool has been closed for the past 3 months so she has not been able swim, which is what she does to relieve her chronic pain.   History of Pure Hypercholesterolemia treated with Rosuvastatin 5 mg three times weekly with supper. 12/12/22 Lipid Panel and Hepatic Panel was WNL.  Past Medical History:  Diagnosis Date   Depression    Osteopenia    Spinal stenosis   Medical/Surgical History Narrative:  2021 - Melanoma in situ removed by Dr. Sharyn Lull, Dermatologist in June.  2015 - History of Chronic Back Pain seen by Dr. Thyra Breed for pain management; MRI showed Bulging Disc and Neuro-foraminal Involvement at L2-S1; had lumbar surgery by Dr. Venetia Maxon.   2010 - Right Knee Surgery with Abrasion Chondroplasty of the Patella, Lateral Tibial Plateau and Medial Femoral Epicondyles; had Synovectomy Of The Suprapatellar Pouch and Medial Joint Right Knee.    1974 - Tonsillectomy  Other: history of Osteopenia. History of Primary Osteoarthritis 1st MCP Joint,  seen by Dr. Amanda Pea. History of Spinal Stenosis. History of Insomnia. History of Hot Flashes, tried Effexor but it caused a headache.  Family History  Problem Relation Age of Onset   Other Mother    Brain cancer Father    Healthy Brother    Healthy Brother   Family History Narrative: Father died at age 21 of a Brain Tumor.  Mother died at age 51 of unknown causes with history of Psoriatic Arthritis. 2 Brothers in good health.   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 Recently had COVID-19.    Review of Systems  Constitutional:  Negative for fever and malaise/fatigue.  HENT:  Negative for congestion.   Eyes:  Negative for blurred vision.  Respiratory:  Negative for cough and shortness of breath.   Cardiovascular:  Negative for chest pain, palpitations and leg swelling.  Gastrointestinal:  Positive for constipation (straining, but still passing stool) and nausea. Negative for vomiting.       (+) Low Appetite (+) Belching & Indigestion - even if she hasn't eaten (+) Abdominal Fullness - like she has just eaten even if she hasn't  Musculoskeletal:  Negative for back pain.  Skin:  Negative for rash.  Neurological:  Positive for headaches (slight). Negative for loss of consciousness.     Objective:  Vitals: BP 110/70   Pulse 73   Ht 5' 6.75" (1.695 m)   Wt 200 lb (90.7 kg)   SpO2 97%   BMI 31.56 kg/m   Physical Exam Vitals and nursing note reviewed.  Constitutional:      General: She is not in acute distress.    Appearance: Normal appearance. She is not toxic-appearing.  HENT:     Head: Normocephalic and atraumatic.  Cardiovascular:     Rate and Rhythm: Normal rate and regular rhythm. No extrasystoles are present.    Pulses: Normal pulses.     Heart sounds: Normal heart sounds. No murmur heard.    No friction rub. No gallop.  Pulmonary:     Effort: Pulmonary effort is normal. No respiratory distress.     Breath sounds: Normal breath sounds. No wheezing or rales.  Skin:    General: Skin is warm and dry.  Neurological:     Mental Status: She is alert and oriented to person, place, and time. Mental status is at baseline.  Psychiatric:        Mood and Affect: Mood normal.        Behavior: Behavior normal.        Thought Content: Thought content normal.        Judgment: Judgment normal.     Results:  Studies Obtained And Personally Reviewed By Me:  Colonoscopy in April 2015 found  external/internal hemorrhoids, diverticulosis in sigmoid/descending colon, and a small lipoma in the ascending colon but was otherwise normal.   Labs:     Component Value Date/Time   NA 141 06/07/2023 1004   K 4.5 06/07/2023 1004   CL 105 06/07/2023 1004   CO2 30 06/07/2023 1004   GLUCOSE 90 06/07/2023 1004   BUN 24 06/07/2023 1004   CREATININE 0.60 06/07/2023 1004   CALCIUM 9.6 06/07/2023 1004   PROT 6.6 12/13/2023 1007   ALBUMIN 4.3 12/31/2016 1043   AST 22 12/13/2023 1007   ALT 18 12/13/2023 1007   ALKPHOS 41 12/31/2016 1043   BILITOT 0.3 12/13/2023 1007   GFRNONAA 92 03/26/2021 0931   GFRAA 106 03/26/2021 0931    Lab Results  Component Value Date   WBC 5.9 06/07/2023   HGB 13.5 06/07/2023   HCT 39.9 06/07/2023   MCV 95.0 06/07/2023   PLT 252 06/07/2023   Lab Results  Component Value Date   CHOL 147 12/13/2023   HDL 61 12/13/2023   LDLCALC  69 12/13/2023   TRIG 88 12/13/2023   CHOLHDL 2.4 12/13/2023   Lab Results  Component Value Date   TSH 1.74 06/07/2023   Assessment & Plan:  Constipation: Recommended to use the laxative she uses occasionally, Ex-lax, to purge bowels, and contact us if that does not resolve symptoms. Sending in 4 mg Zofran for you nausea - take 1 tablet (4 mg total) by mouth every 8 (eight) hours as needed. CBC w/Differential/Platelet and POCT URINALYSIS DIP ordered.   Pure Hypercholesterolemia treated with Rosuvastatin 5 mg three times weekly with supper. 12/12/22 Lipid Panel and Hepatic Panel was WNL.  Colonoscopy will be due April 2025.  Medicare wellness and health maintenace exam due in July   I,Emily Lagle,acting as a scribe for Margaree Mackintosh, MD.,have documented all relevant documentation on the behalf of Margaree Mackintosh, MD,as directed by  Margaree Mackintosh, MD while in the presence of Margaree Mackintosh, MD.   I, Margaree Mackintosh, MD, have reviewed all documentation for this visit. The documentation on 12/30/23 for the exam, diagnosis, procedures,  and orders are all accurate and complete.

## 2023-12-30 ENCOUNTER — Encounter: Payer: Self-pay | Admitting: Internal Medicine

## 2023-12-30 NOTE — Patient Instructions (Addendum)
It was a pleasure to see you today.Suspect Pain meds may be causing obstipation or constipation. May need to see GI physician for this.Colonoscopy is due April 2025. Please call for appointment.

## 2024-01-01 ENCOUNTER — Other Ambulatory Visit: Payer: Self-pay | Admitting: Internal Medicine

## 2024-01-02 NOTE — Progress Notes (Signed)
Scheduled CPE, AMW

## 2024-01-08 ENCOUNTER — Other Ambulatory Visit: Payer: Self-pay | Admitting: Internal Medicine

## 2024-01-13 ENCOUNTER — Other Ambulatory Visit (HOSPITAL_COMMUNITY): Payer: Self-pay

## 2024-01-17 DIAGNOSIS — Z79891 Long term (current) use of opiate analgesic: Secondary | ICD-10-CM | POA: Diagnosis not present

## 2024-01-17 DIAGNOSIS — G894 Chronic pain syndrome: Secondary | ICD-10-CM | POA: Diagnosis not present

## 2024-01-17 DIAGNOSIS — M150 Primary generalized (osteo)arthritis: Secondary | ICD-10-CM | POA: Diagnosis not present

## 2024-01-17 DIAGNOSIS — M47816 Spondylosis without myelopathy or radiculopathy, lumbar region: Secondary | ICD-10-CM | POA: Diagnosis not present

## 2024-02-01 ENCOUNTER — Ambulatory Visit
Admission: EM | Admit: 2024-02-01 | Discharge: 2024-02-01 | Disposition: A | Discharge status: Home / Self Care | Attending: Nurse Practitioner | Admitting: Nurse Practitioner

## 2024-02-01 DIAGNOSIS — J069 Acute upper respiratory infection, unspecified: Secondary | ICD-10-CM | POA: Diagnosis not present

## 2024-02-01 LAB — POC COVID19/FLU A&B COMBO
Covid Antigen, POC: NEGATIVE
Influenza A Antigen, POC: NEGATIVE
Influenza B Antigen, POC: NEGATIVE

## 2024-02-01 MED ORDER — PROMETHAZINE-DM 6.25-15 MG/5ML PO SYRP: 5.0000 mL | ORAL_SOLUTION | Freq: Four times a day (QID) | ORAL | 0 refills | Status: DC | PRN

## 2024-02-01 MED ORDER — FLUTICASONE PROPIONATE 50 MCG/ACT NA SUSP: 2.0000 | Freq: Every day | NASAL | 0 refills | Status: AC

## 2024-02-01 NOTE — ED Provider Notes (Signed)
 [facility]    CSN: [id number] Arrival date & time: 02/01/24  1255      History   Chief Complaint No chief complaint on file.   HPI Natalie Parker is a 73 y.o. female.   The history is provided by the patient.    Past Medical History:  Diagnosis Date   Depression    Osteopenia    Spinal stenosis     Patient Active Problem List   Diagnosis Date Noted   Depression 10/10/2012   Insomnia 10/10/2012   Bilateral hip pain 03/29/2012   Spinal stenosis 09/29/2011   Hot flashes 09/29/2011   History of smoking 09/29/2011   Osteopenia 09/29/2011   Low back pain 09/27/2011    Past Surgical History:  Procedure Laterality Date   repair torn meniscus     TONSILLECTOMY      OB History   No obstetric history on file.      Home Medications    Prior to Admission medications   Medication Sig Start Date End Date Taking? Authorizing Provider  Calcium Carb-Cholecalciferol (CALCIUM 1000 + D) 1000-20 MG-MCG TABS Take 1,000 mg by mouth daily with supper.    [provider]  cholecalciferol (VITAMIN D) 1000 UNITS tablet Take 2,000 Units by mouth daily.    [provider]  Cranberry 125 MG TABS Take by mouth.    [provider]  cyclobenzaprine (FLEXERIL) 10 MG tablet Take 10 mg by mouth at bedtime. 09/07/18   [provider]  HYDROcodone-acetaminophen (NORCO/VICODIN) 5-325 MG tablet Take 1 tablet by mouth 2 (two) times daily. 09/07/18   [provider]  Multiple Vitamin (MULTIVITAMIN) tablet Take 1 tablet by mouth daily.    [provider]  naproxen sodium (ALEVE) 220 MG tablet Take 220 mg by mouth 2 (two) times daily as needed.    [provider]  Omega-3 Fatty Acids (FISH OIL) 1000 MG CAPS Take by mouth.    [provider]  ondansetron (ZOFRAN) 4 MG tablet Take 1 tablet (4 mg total) by mouth every 8 (eight) hours as needed for nausea or vomiting. 12/15/23   Margaree Mackintosh, MD  rosuvastatin  (CRESTOR) 5 MG tablet TAKE 1 TABLET BY MOUTH THREE TIMES A WEEK AS DIRECTED WITH SUPPER 01/09/24   Margaree Mackintosh, MD  TURMERIC PO Take by mouth.    [provider]  Vitamin D-Vitamin K (VITAMIN K2-VITAMIN D3 PO) Take by mouth.    [provider]    Family History Family History  Problem Relation Age of Onset   Other Mother    Brain cancer Father    Healthy Brother    Healthy Brother     Social History Social History   Tobacco Use   Smoking status: Former    Current packs/day: 0.00    Average packs/day: 0.5 packs/day for 20.0 years (10.0 ttl pk-yrs)    Types: Cigarettes    Start date: 02/29/1992    Quit date: 02/29/2012    Years since quitting: 11.9   Smokeless tobacco: Never  Substance Use Topics   Alcohol use: Yes    Comment: occasional   Drug use: No     Allergies   E-mycin [erythromycin base]   Review of Systems Review of Systems Per HPI  Physical Exam Triage Vital Signs ED Triage Vitals  Encounter Vitals Group     BP 02/01/24 1301 132/75     Systolic BP Percentile --      Diastolic BP Percentile --  Pulse Rate 02/01/24 1301 88     Resp 02/01/24 1301 16     Temp 02/01/24 1301 99.4 F (37.4 C)     Temp Source 02/01/24 1301 Oral     SpO2 02/01/24 1301 95 %     Weight --      Height --      Head Circumference --      Peak Flow --      Pain Score 02/01/24 1304 0     Pain Loc --      Pain Education --      Exclude from Growth Chart --    No data found.  Updated Vital Signs BP 132/75 (BP Location: Right Arm)   Pulse 88   Temp 99.4 F (37.4 C) (Oral)   Resp 16   SpO2 95%   Visual Acuity Right Eye Distance:   Left Eye Distance:   Bilateral Distance:    Right Eye Near:   Left Eye Near:    Bilateral Near:     Physical Exam Vitals and nursing note reviewed.  Constitutional:      Appearance: Normal appearance.  HENT:     Head: Normocephalic.     Right Ear: Tympanic membrane, ear canal and external ear normal.     Left  Ear: Tympanic membrane, ear canal and external ear normal.     Nose: Congestion present.     Right Turbinates: Enlarged and swollen.     Left Turbinates: Enlarged and swollen.     Right Sinus: No maxillary sinus tenderness or frontal sinus tenderness.     Left Sinus: No maxillary sinus tenderness or frontal sinus tenderness.     Mouth/Throat:     Lips: Pink.     Mouth: Mucous membranes are moist.     Pharynx: Postnasal drip present. No pharyngeal swelling, oropharyngeal exudate, posterior oropharyngeal erythema or uvula swelling.  Eyes:     Extraocular Movements: Extraocular movements intact.     Conjunctiva/sclera: Conjunctivae normal.     Pupils: Pupils are equal, round, and reactive to light.  Cardiovascular:     Rate and Rhythm: Normal rate and regular rhythm.     Pulses: Normal pulses.     Heart sounds: Normal heart sounds.  Pulmonary:     Effort: Pulmonary effort is normal. No respiratory distress.     Breath sounds: Normal breath sounds. No stridor. No wheezing, rhonchi or rales.  Abdominal:     General: Bowel sounds are normal.     Palpations: Abdomen is soft.     Tenderness: There is no abdominal tenderness.  Musculoskeletal:     Cervical back: Normal range of motion.  Lymphadenopathy:     Cervical: No cervical adenopathy.  Skin:    General: Skin is warm and dry.  Neurological:     General: No focal deficit present.     Mental Status: She is alert and oriented to person, place, and time.  Psychiatric:        Mood and Affect: Mood normal.        Behavior: Behavior normal.      UC Treatments / Results  Labs (all labs ordered are listed, but only abnormal results are displayed) Labs Reviewed  POC COVID19/FLU A&B COMBO    EKG   Radiology No results found.  Procedures Procedures (including critical care time)  Medications Ordered in UC Medications - No data to display  Initial Impression / Assessment and Plan / UC Course  I have reviewed the  triage  vital signs and the nursing notes.  Pertinent labs & imaging results that were available during my care of the patient were reviewed by me and considered in my medical decision making (see chart for details).  COVID/flu test was negative.  Symptoms are consistent with a viral URI with cough.  Will provide symptomatic treatment with Promethazine DM for the cough, and fluticasone 50 micro nasal spray for nasal congestion and runny nose.  Supportive care recommendations were provided and discussed with the patient to include over-the-counter analgesics, normal saline nasal spray, and use of a humidifier at nighttime during sleep.  Discussed indications regarding follow-up.  Patient was in agreement with this plan of care and verbalized understanding.  All questions were answered.  Patient stable for discharge.  Final Clinical Impressions(s) / UC Diagnoses   Final diagnoses:  None   Discharge Instructions   None    ED Prescriptions   None    PDMP not reviewed this encounter.   Abran Cantor, NP 02/01/24 [phone]

## 2024-02-01 NOTE — Discharge Instructions (Addendum)
 The COVID/flu test was negative. Take medication as prescribed. Increase fluids and allow for plenty of rest. Recommend Tylenol or ibuprofen as needed for pain, fever, or general discomfort. Warm salt water gargles 3-4 times daily to help with throat pain or discomfort. Recommend using a humidifier at bedtime and sleeping elevated on pillows while symptoms persist. Symptoms should improver over the next 5 to 7 days. If symptoms fail to improve or appear to be worsening, you may follow-up in this clinic or with your PCP for further evaluation.  Follow-up as needed.

## 2024-02-01 NOTE — ED Triage Notes (Signed)
 Pt reports nasal congestion, chest congestion headache nausea, bilateral ear pain, cough x 1 day

## 2024-02-02 ENCOUNTER — Ambulatory Visit: Payer: Self-pay

## 2024-02-02 ENCOUNTER — Ambulatory Visit: Admitting: Internal Medicine

## 2024-02-09 DIAGNOSIS — L570 Actinic keratosis: Secondary | ICD-10-CM | POA: Diagnosis not present

## 2024-02-09 DIAGNOSIS — L918 Other hypertrophic disorders of the skin: Secondary | ICD-10-CM | POA: Diagnosis not present

## 2024-02-24 ENCOUNTER — Telehealth: Payer: Self-pay | Admitting: Internal Medicine

## 2024-02-24 ENCOUNTER — Encounter: Payer: Self-pay | Admitting: Internal Medicine

## 2024-02-24 ENCOUNTER — Ambulatory Visit: Admitting: Internal Medicine

## 2024-02-24 VITALS — BP 120/78 | HR 65 | Temp 97.8°F | Resp 12 | Ht 66.75 in | Wt 192.0 lb

## 2024-02-24 DIAGNOSIS — J22 Unspecified acute lower respiratory infection: Secondary | ICD-10-CM

## 2024-02-24 DIAGNOSIS — J029 Acute pharyngitis, unspecified: Secondary | ICD-10-CM

## 2024-02-24 DIAGNOSIS — R059 Cough, unspecified: Secondary | ICD-10-CM

## 2024-02-24 LAB — POC COVID19/FLU A&B COMBO
Covid Antigen, POC: NEGATIVE
Influenza A Antigen, POC: NEGATIVE
Influenza B Antigen, POC: NEGATIVE

## 2024-02-24 LAB — POCT RAPID STREP A (OFFICE): Rapid Strep A Screen: NEGATIVE

## 2024-02-24 MED ORDER — AZITHROMYCIN 250 MG PO TABS: ORAL_TABLET | ORAL | 0 refills | Status: AC

## 2024-02-24 MED ORDER — HYDROCOD POLI-CHLORPHE POLI ER 10-8 MG/5ML PO SUER: 5.0000 mL | Freq: Two times a day (BID) | ORAL | 0 refills | Status: DC | PRN

## 2024-02-24 NOTE — Telephone Encounter (Signed)
 Copied from CRM [id number]. Topic: Clinical - Medical Advice >> Feb 24, 2024  9:05 AM Ivette P wrote: Reason for CRM: Pt calling in about getting an antibiotic or something to cover the cold or cough. PT is having a lot of mucus  Pt callback [phone]

## 2024-02-24 NOTE — Progress Notes (Signed)
 Patient Care Team: Margaree Mackintosh, MD as PCP - General (Internal Medicine)  Visit Date: 02/24/24  Subjective:   Chief Complaint  Patient presents with   Headache   Nasal Congestion    Runny nose    Cough  Patient Natalie Parker,Female DOB:12-11-1950,73 y.o. FIE:[id number]   73 y.o. Female presents today for acute sick visit with Headache, Nasal Congestion/Rhinorrhea; Cough. UTD on flu vaccine (09/15/2023). Seen at an UC on 3/5, where she was prescribed Flonase and Promethazine-DM. Covid-19 and Flu A/B was negative that day. She says that she has been using the Flonase and cough suppressant, but since she is scheduled for an upcoming colonoscopy she hasn't been able to use red-dyed cough syrup.  Past Medical History:  Diagnosis Date   Depression    Osteopenia    Spinal stenosis     Allergies  Allergen Reactions   E-Mycin [Erythromycin Base] Rash    Family History  Problem Relation Age of Onset   Other Mother    Brain cancer Father    Healthy Brother    Healthy Brother    Social Hx: Retired Interior and spatial designer. Married.  Review of Systems  HENT:  Positive for congestion (nasal w/ rhinorrhea).   Respiratory:  Positive for cough.   Neurological:  Positive for headaches.     Objective:  Vitals: BP 120/78 (BP Location: Right Arm, Patient Position: Sitting, Cuff Size: Normal)   Pulse 65   Temp 97.8 F (36.6 C) (Temporal)   Resp 12   Ht 5' 6.75" (1.695 m)   Wt 192 lb (87.1 kg)   SpO2 96%   BMI 30.30 kg/m   Physical Exam Vitals and nursing note reviewed.  Constitutional:      General: She is not in acute distress.    Appearance: Normal appearance. She is not ill-appearing.  HENT:     Head: Normocephalic and atraumatic.     Right Ear: Tympanic membrane and external ear normal.     Left Ear: Tympanic membrane, ear canal and external ear normal.     Ears:     Comments: TMs bilaterally full.  Right canal erythematous     Mouth/Throat:     Mouth: Mucous membranes are  moist.     Pharynx: Posterior oropharyngeal erythema present. No oropharyngeal exudate.  Pulmonary:     Effort: Pulmonary effort is normal.     Breath sounds: Normal breath sounds. No wheezing, rhonchi or rales.  Lymphadenopathy:     Cervical: No cervical adenopathy.  Skin:    General: Skin is warm and dry.  Neurological:     Mental Status: She is alert and oriented to person, place, and time. Mental status is at baseline.  Psychiatric:        Mood and Affect: Mood normal.        Behavior: Behavior normal.        Thought Content: Thought content normal.        Judgment: Judgment normal.     Results:  Studies Obtained And Personally Reviewed By Me: Labs:     Component Value Date/Time   NA 141 06/07/2023 1004   K 4.5 06/07/2023 1004   CL 105 06/07/2023 1004   CO2 30 06/07/2023 1004   GLUCOSE 90 06/07/2023 1004   BUN 24 06/07/2023 1004   CREATININE 0.60 06/07/2023 1004   CALCIUM 9.6 06/07/2023 1004   PROT 6.6 12/13/2023 1007   ALBUMIN 4.3 12/31/2016 1043   AST 22 12/13/2023 1007   ALT 18  12/13/2023 1007   ALKPHOS 41 12/31/2016 1043   BILITOT 0.3 12/13/2023 1007   GFRNONAA 92 03/26/2021 0931   GFRAA 106 03/26/2021 0931    Lab Results  Component Value Date   WBC 6.1 12/15/2023   HGB 13.4 12/15/2023   HCT 40.6 12/15/2023   MCV 95.3 12/15/2023   PLT 281 12/15/2023   Lab Results  Component Value Date   CHOL 147 12/13/2023   HDL 61 12/13/2023   LDLCALC 69 12/13/2023   TRIG 88 12/13/2023   CHOLHDL 2.4 12/13/2023   Lab Results  Component Value Date   TSH 1.74 06/07/2023    Results for orders placed or performed in visit on 02/24/24  POC Covid19/Flu A&B Antigen  Result Value Ref Range   Influenza A Antigen, POC Negative Negative   Influenza B Antigen, POC Negative Negative   Covid Antigen, POC Negative Negative  POCT rapid strep A  Result Value Ref Range   Rapid Strep A Screen Negative Negative   Assessment & Plan:  Lower Respiratory Infection: Symptoms  include Headache, Nasal Congestion/Rhinorrhea; Cough: UTD on flu vaccine (09/15/2023). Seen at an urgent care on 3/5, where she was prescribed Flonase and Promethazine-DM. Covid-19 and Flu A/B were negative that day and today Covid-19, Flu A/B and Rapid Strep is also negative. She has been using the Flonase and cough suppressant, but since she is scheduled for an upcoming colonoscopy she hasn't been able to use red-dyed cough syrup. Sending in 250 mg Azithromycin - take 2 tablets on Day 1 and 1 tablet on Days 2-5 and Tussionex - take 5 mLs by mouth every 12 (twelve) hours as needed for cough. Contact us if symptoms worsen/persist despite treatment.        I,Emily Lagle,acting as a Neurosurgeon for Margaree Mackintosh, MD.,have documented all relevant documentation on the behalf of Margaree Mackintosh, MD,as directed by  Margaree Mackintosh, MD while in the presence of Margaree Mackintosh, MD.   I, Margaree Mackintosh, MD, have reviewed all documentation for this visit. The documentation on 02/25/24 for the exam, diagnosis, procedures, and orders are all accurate and complete.

## 2024-02-24 NOTE — Telephone Encounter (Signed)
Called patient and schedule an appointment.

## 2024-02-25 ENCOUNTER — Encounter: Payer: Self-pay | Admitting: Internal Medicine

## 2024-02-25 NOTE — Patient Instructions (Signed)
 Rest and stay well hydrated. Multiple tests performed  which  were negative for flu, covid, and strep throat. Take Zithromax Z pak 2 tabs day 1 followed by one tab days 2-5. Take Tussionex sparingly for cough.Contact us if not improving within 3-5 days or sooner if worse.

## 2024-02-27 DIAGNOSIS — Z09 Encounter for follow-up examination after completed treatment for conditions other than malignant neoplasm: Secondary | ICD-10-CM | POA: Diagnosis not present

## 2024-02-27 DIAGNOSIS — K5730 Diverticulosis of large intestine without perforation or abscess without bleeding: Secondary | ICD-10-CM | POA: Diagnosis not present

## 2024-02-27 DIAGNOSIS — Z860100 Personal history of colon polyps, unspecified: Secondary | ICD-10-CM | POA: Diagnosis not present

## 2024-02-27 LAB — HM COLONOSCOPY

## 2024-02-28 ENCOUNTER — Encounter: Payer: Self-pay | Admitting: Internal Medicine

## 2024-03-01 ENCOUNTER — Ambulatory Visit (INDEPENDENT_AMBULATORY_CARE_PROVIDER_SITE_OTHER): Admitting: Internal Medicine

## 2024-03-01 ENCOUNTER — Telehealth: Payer: Self-pay | Admitting: Internal Medicine

## 2024-03-01 ENCOUNTER — Other Ambulatory Visit: Payer: Self-pay

## 2024-03-01 ENCOUNTER — Ambulatory Visit
Admission: RE | Admit: 2024-03-01 | Discharge: 2024-03-01 | Disposition: A | Discharge status: Home / Self Care | Source: Ambulatory Visit | Attending: Internal Medicine | Admitting: Internal Medicine

## 2024-03-01 VITALS — BP 116/68 | HR 71 | Temp 97.9°F | Ht 66.75 in | Wt 201.0 lb

## 2024-03-01 DIAGNOSIS — R0989 Other specified symptoms and signs involving the circulatory and respiratory systems: Secondary | ICD-10-CM | POA: Diagnosis not present

## 2024-03-01 DIAGNOSIS — Z79891 Long term (current) use of opiate analgesic: Secondary | ICD-10-CM | POA: Diagnosis not present

## 2024-03-01 DIAGNOSIS — R053 Chronic cough: Secondary | ICD-10-CM

## 2024-03-01 DIAGNOSIS — G894 Chronic pain syndrome: Secondary | ICD-10-CM | POA: Diagnosis not present

## 2024-03-01 DIAGNOSIS — R051 Acute cough: Secondary | ICD-10-CM | POA: Diagnosis not present

## 2024-03-01 DIAGNOSIS — M150 Primary generalized (osteo)arthritis: Secondary | ICD-10-CM | POA: Diagnosis not present

## 2024-03-01 DIAGNOSIS — M47816 Spondylosis without myelopathy or radiculopathy, lumbar region: Secondary | ICD-10-CM | POA: Diagnosis not present

## 2024-03-01 MED ORDER — HYDROCOD POLI-CHLORPHE POLI ER 10-8 MG/5ML PO SUER: 5.0000 mL | Freq: Two times a day (BID) | ORAL | 0 refills | Status: AC | PRN

## 2024-03-01 MED ORDER — DOXYCYCLINE HYCLATE 100 MG PO TABS: 100.0000 mg | ORAL_TABLET | Freq: Two times a day (BID) | ORAL | 0 refills | Status: DC

## 2024-03-01 NOTE — Progress Notes (Signed)
 Called Chest X-ray normal, patient verbalized understanding.

## 2024-03-01 NOTE — Telephone Encounter (Signed)
 Called scheduled appoinrtment

## 2024-03-01 NOTE — Progress Notes (Signed)
 Patient Care Team: Sylvan Evener, MD as PCP - General (Internal Medicine)  Visit Date: 03/01/24  Subjective:   Chief Complaint  Patient presents with   Follow-up    Pt states that she still feel very congested and has a runny nose possibly allergies due to maybe the pollen   Patient UJ:WJXB L Ellerson,Female DOB:12-Jan-1951,73 y.o. JYN:[id number]   73 y.o. Female presents today for acute sick visit with Cough, Sneezing. Seen 02/24/2024 for Lower Respiratory Infection with similar symptoms treated with Zpak x5 days. Today she says she is still coughing with expectoration of green sputum and sneezing with white mucus. Also says that the recent high pollen density is probably contributing. Notes that her husband is experiencing similar symptoms.  Past Medical History:  Diagnosis Date   Depression    Osteopenia    Spinal stenosis     Allergies  Allergen Reactions   E-Mycin [Erythromycin Base] Rash    Family History  Problem Relation Age of Onset   Other Mother    Brain cancer Father    Healthy Brother    Healthy Brother    Social Hx: Retired Interior and spatial designer.. Married.  Review of Systems  Constitutional:  Negative for chills and fever.  HENT:  Positive for congestion (/rhinorrhea, sneezing w/  white mucus).   Respiratory:  Positive for cough and sputum production (green).   Neurological:  Negative for headaches.     Objective:  Vitals: BP 116/68 (BP Location: Left Arm, Patient Position: Sitting, Cuff Size: Normal)   Pulse 71   Temp 97.9 F (36.6 C) (Oral)   Ht 5' 6.75" (1.695 m)   Wt 201 lb (91.2 kg)   SpO2 98%   BMI 31.72 kg/m   Physical Exam Vitals and nursing note reviewed.  Constitutional:      General: She is not in acute distress.    Appearance: Normal appearance. She is not ill-appearing.  HENT:     Head: Normocephalic and atraumatic.     Right Ear: Ear canal and external ear normal. Tympanic membrane is not erythematous.     Left Ear: Ear canal and external ear  normal. Tympanic membrane is not erythematous.     Ears:     Comments: Right TM dull, not red Left TM full w/ splayed light reflex    Mouth/Throat:     Mouth: Mucous membranes are moist.     Pharynx: Posterior oropharyngeal erythema present. No oropharyngeal exudate.     Comments: Throat slightly injected Pulmonary:     Effort: Pulmonary effort is normal.     Breath sounds: Rales (bilateral) present. No wheezing or rhonchi.     Comments: Congested cough w/ green sputum Lymphadenopathy:     Cervical: No cervical adenopathy.  Skin:    General: Skin is warm and dry.  Neurological:     Mental Status: She is alert and oriented to person, place, and time. Mental status is at baseline.  Psychiatric:        Mood and Affect: Mood normal.        Behavior: Behavior normal.        Thought Content: Thought content normal.        Judgment: Judgment normal.     Results:  Studies Obtained And Personally Reviewed By Me: Labs:     Component Value Date/Time   NA 141 06/07/2023 1004   K 4.5 06/07/2023 1004   CL 105 06/07/2023 1004   CO2 30 06/07/2023 1004   GLUCOSE 90 06/07/2023  1004   BUN 24 06/07/2023 1004   CREATININE 0.60 06/07/2023 1004   CALCIUM 9.6 06/07/2023 1004   PROT 6.6 12/13/2023 1007   ALBUMIN 4.3 12/31/2016 1043   AST 22 12/13/2023 1007   ALT 18 12/13/2023 1007   ALKPHOS 41 12/31/2016 1043   BILITOT 0.3 12/13/2023 1007   GFRNONAA 92 03/26/2021 0931   GFRAA 106 03/26/2021 0931    Lab Results  Component Value Date   WBC 6.1 12/15/2023   HGB 13.4 12/15/2023   HCT 40.6 12/15/2023   MCV 95.3 12/15/2023   PLT 281 12/15/2023   Lab Results  Component Value Date   CHOL 147 12/13/2023   HDL 61 12/13/2023   LDLCALC 69 12/13/2023   TRIG 88 12/13/2023   CHOLHDL 2.4 12/13/2023    Lab Results  Component Value Date   TSH 1.74 06/07/2023   Assessment & Plan:   Bilateral Rales/ lower respiratory infection. seen in this office on 3/28 with similar symptoms that have not  resolved after taking a Z-pak Sending in 100 mg Doxycycline - take 1 tablet (100 mg total) by mouth 2 (two) times daily and Tussionex - take 5 mLs by mouth every 12 (twelve) hours as needed for cough. Ordering CXR to be done today. Stay well rested, well hydrated, and well nourished. Walk around some to prevent atelectasis. Contact us  if symptoms worsen/persist despite treatment.    Addendum:CXR shows no acute abnormality. Pt informed of results.  I,Emily Lagle,acting as a Neurosurgeon for Sylvan Evener, MD.,have documented all relevant documentation on the behalf of Sylvan Evener, MD,as directed by  Sylvan Evener, MD while in the presence of Sylvan Evener, MD.   I, Sylvan Evener, MD, have reviewed all documentation for this visit. The documentation on 03/11/24 for the exam, diagnosis, procedures, and orders are all accurate and complete.

## 2024-03-01 NOTE — Telephone Encounter (Signed)
 Copied from CRM [id number]. Topic: Clinical - Prescription Issue >> Mar 01, 2024  9:19 AM Ivette P wrote: Reason for CRM: PT came in last week for app on 03/28 for being sick and meds did not work. Pt is still sick and asking for stronger medication. Pt callback [phone]

## 2024-03-02 ENCOUNTER — Ambulatory Visit: Payer: Self-pay

## 2024-03-02 DIAGNOSIS — J069 Acute upper respiratory infection, unspecified: Secondary | ICD-10-CM

## 2024-03-02 MED ORDER — METHYLPREDNISOLONE 4 MG PO TABS: ORAL_TABLET | ORAL | 0 refills | Status: DC

## 2024-03-02 NOTE — Telephone Encounter (Addendum)
 Info only-no triage: Pt was seen yesterday and stated the PCP mentioned a steroid would be called in. When pt picked up antibiotic, there was no order for steroid. Pt is wanting to make sure the PCP is aware she doesn't have one and to call it in if needed. Please inform pt of decision.         Copied from CRM [id number]. Topic: Clinical - Medication Question >> Mar 02, 2024  9:06 AM Clayton Bibles wrote: Reason for CRM:  Danielly wanted to know if Dr. Lenord Fellers was going to call in a steroid that they discuss during the appointment yesterday. Please send a message through MyChart if she is or if she is not ordering medication. Thanks Reason for Disposition  Health Information question, no triage required and triager able to answer question  Answer Assessment - Initial Assessment Questions 1. REASON FOR CALL or QUESTION: "What is your reason for calling today?" or "How can I best help you?" or "What question do you have that I can help answer?"     Pt wanted to know if she would suppose to get a steroid medication when she picked up antibiotic,  Protocols used: Information Only Call - No Triage-A-AH   I have this this Rx in Just now 10:07am. MJB, MD

## 2024-03-11 ENCOUNTER — Encounter: Payer: Self-pay | Admitting: Internal Medicine

## 2024-03-11 NOTE — Patient Instructions (Addendum)
 CXR ordered and results were negative for pneumonia. Rest and stay well hydrated.Take Doxycycline 100 mg twice daily for 10 days. Take Tussionex one tsp every 12 hours as needed for cough.Call if not improved in 7-10 days.

## 2024-03-19 ENCOUNTER — Other Ambulatory Visit: Payer: Self-pay | Admitting: Internal Medicine

## 2024-03-19 DIAGNOSIS — Z1231 Encounter for screening mammogram for malignant neoplasm of breast: Secondary | ICD-10-CM

## 2024-03-29 ENCOUNTER — Telehealth: Payer: Self-pay

## 2024-03-29 ENCOUNTER — Other Ambulatory Visit (HOSPITAL_COMMUNITY): Payer: Self-pay

## 2024-03-29 NOTE — Telephone Encounter (Signed)
 Pharmacy Patient Advocate Encounter   Received notification from CoverMyMeds that prior authorization for Ondansetron  HCl 4MG  tablets  is required/requested.   Insurance verification completed.   The patient is insured through [facility] ADVANTAGE/RX ADVANCE .   Per test claim: PA required; PA started via CoverMyMeds. KEY [id number] . Waiting for clinical questions to populate.

## 2024-03-29 NOTE — Telephone Encounter (Signed)
 Pharmacy Patient Advocate Encounter  Received notification from [facility] ADVANTAGE/RX ADVANCE that Prior Authorization for Ondansetron  HCl 4MG  tablets  No pa needed pharmacy put it on discount card pt paid cash for it.    PA #/Case ID/Reference #: KEY [facility]

## 2024-04-04 ENCOUNTER — Other Ambulatory Visit (HOSPITAL_COMMUNITY): Payer: Self-pay

## 2024-04-05 ENCOUNTER — Ambulatory Visit
Admission: RE | Admit: 2024-04-05 | Discharge: 2024-04-05 | Disposition: A | Discharge status: Home / Self Care | Source: Ambulatory Visit | Attending: Internal Medicine | Admitting: Internal Medicine

## 2024-04-05 DIAGNOSIS — Z1231 Encounter for screening mammogram for malignant neoplasm of breast: Secondary | ICD-10-CM

## 2024-05-03 DIAGNOSIS — M47816 Spondylosis without myelopathy or radiculopathy, lumbar region: Secondary | ICD-10-CM | POA: Diagnosis not present

## 2024-05-03 DIAGNOSIS — M150 Primary generalized (osteo)arthritis: Secondary | ICD-10-CM | POA: Diagnosis not present

## 2024-05-03 DIAGNOSIS — Z79891 Long term (current) use of opiate analgesic: Secondary | ICD-10-CM | POA: Diagnosis not present

## 2024-05-03 DIAGNOSIS — G894 Chronic pain syndrome: Secondary | ICD-10-CM | POA: Diagnosis not present

## 2024-06-12 ENCOUNTER — Other Ambulatory Visit: Payer: PPO

## 2024-06-12 DIAGNOSIS — E7800 Pure hypercholesterolemia, unspecified: Secondary | ICD-10-CM

## 2024-06-12 DIAGNOSIS — Z0000 Encounter for general adult medical examination without abnormal findings: Secondary | ICD-10-CM | POA: Diagnosis not present

## 2024-06-12 DIAGNOSIS — M8580 Other specified disorders of bone density and structure, unspecified site: Secondary | ICD-10-CM

## 2024-06-12 DIAGNOSIS — K219 Gastro-esophageal reflux disease without esophagitis: Secondary | ICD-10-CM

## 2024-06-12 LAB — COMPLETE METABOLIC PANEL WITHOUT GFR
AG Ratio: 2 (calc) (ref 1.0–2.5)
ALT: 15 U/L (ref 6–29)
AST: 20 U/L (ref 10–35)
Albumin: 4.7 g/dL (ref 3.6–5.1)
Alkaline phosphatase (APISO): 54 U/L (ref 37–153)
BUN: 22 mg/dL (ref 7–25)
CO2: 29 mmol/L (ref 20–32)
Calcium: 9.7 mg/dL (ref 8.6–10.4)
Chloride: 104 mmol/L (ref 98–110)
Creat: 0.6 mg/dL (ref 0.60–1.00)
Globulin: 2.3 g/dL (ref 1.9–3.7)
Glucose, Bld: 96 mg/dL (ref 65–99)
Potassium: 4.5 mmol/L (ref 3.5–5.3)
Sodium: 142 mmol/L (ref 135–146)
Total Bilirubin: 0.4 mg/dL (ref 0.2–1.2)
Total Protein: 7 g/dL (ref 6.1–8.1)

## 2024-06-12 LAB — CBC WITH DIFFERENTIAL/PLATELET
Absolute Lymphocytes: 2177 {cells}/uL (ref 850–3900)
Absolute Monocytes: 570 {cells}/uL (ref 200–950)
Basophils Absolute: 29 {cells}/uL (ref 0–200)
Basophils Relative: 0.5 %
Eosinophils Absolute: 171 {cells}/uL (ref 15–500)
Eosinophils Relative: 3 %
HCT: 41.4 % (ref 35.0–45.0)
Hemoglobin: 13.1 g/dL (ref 11.7–15.5)
MCH: 31.1 pg (ref 27.0–33.0)
MCHC: 31.6 g/dL — ABNORMAL LOW (ref 32.0–36.0)
MCV: 98.3 fL (ref 80.0–100.0)
MPV: 9.1 fL (ref 7.5–12.5)
Monocytes Relative: 10 %
Neutro Abs: 2753 {cells}/uL (ref 1500–7800)
Neutrophils Relative %: 48.3 %
Platelets: 252 Thousand/uL (ref 140–400)
RBC: 4.21 Million/uL (ref 3.80–5.10)
RDW: 12.8 % (ref 11.0–15.0)
Total Lymphocyte: 38.2 %
WBC: 5.7 Thousand/uL (ref 3.8–10.8)

## 2024-06-12 LAB — LIPID PANEL
Cholesterol: 183 mg/dL (ref <200)
HDL: 62 mg/dL (ref >=50)
LDL Cholesterol (Calc): 103 mg/dL — ABNORMAL HIGH
Non-HDL Cholesterol (Calc): 121 mg/dL (ref <130)
Total CHOL/HDL Ratio: 3 (calc) (ref <5.0)
Triglycerides: 90 mg/dL (ref <150)

## 2024-06-12 LAB — TSH: TSH: 1.42 m[IU]/L (ref 0.40–4.50)

## 2024-06-15 ENCOUNTER — Other Ambulatory Visit: Payer: Self-pay | Admitting: Internal Medicine

## 2024-06-15 ENCOUNTER — Encounter: Payer: Self-pay | Admitting: Internal Medicine

## 2024-06-15 ENCOUNTER — Ambulatory Visit: Payer: PPO | Admitting: Internal Medicine

## 2024-06-15 VITALS — BP 120/80 | HR 74 | Ht 66.75 in | Wt 200.0 lb

## 2024-06-15 DIAGNOSIS — K219 Gastro-esophageal reflux disease without esophagitis: Secondary | ICD-10-CM | POA: Diagnosis not present

## 2024-06-15 DIAGNOSIS — F419 Anxiety disorder, unspecified: Secondary | ICD-10-CM | POA: Diagnosis not present

## 2024-06-15 DIAGNOSIS — E7800 Pure hypercholesterolemia, unspecified: Secondary | ICD-10-CM | POA: Diagnosis not present

## 2024-06-15 DIAGNOSIS — F439 Reaction to severe stress, unspecified: Secondary | ICD-10-CM

## 2024-06-15 DIAGNOSIS — M19049 Primary osteoarthritis, unspecified hand: Secondary | ICD-10-CM

## 2024-06-15 DIAGNOSIS — Z0000 Encounter for general adult medical examination without abnormal findings: Secondary | ICD-10-CM | POA: Diagnosis not present

## 2024-06-15 DIAGNOSIS — M8580 Other specified disorders of bone density and structure, unspecified site: Secondary | ICD-10-CM | POA: Diagnosis not present

## 2024-06-15 LAB — POCT URINALYSIS DIP (CLINITEK)
Bilirubin, UA: NEGATIVE
Blood, UA: NEGATIVE
Glucose, UA: NEGATIVE mg/dL
Ketones, POC UA: NEGATIVE mg/dL
Leukocytes, UA: NEGATIVE
Nitrite, UA: NEGATIVE
POC PROTEIN,UA: NEGATIVE
Spec Grav, UA: 1.015 (ref 1.010–1.025)
Urobilinogen, UA: 0.2 U/dL
pH, UA: 6.5 (ref 5.0–8.0)

## 2024-06-15 MED ORDER — ALPRAZOLAM 0.5 MG PO TABS: 0.5000 mg | ORAL_TABLET | Freq: Two times a day (BID) | ORAL | 0 refills | Status: AC | PRN

## 2024-06-15 NOTE — Progress Notes (Addendum)
 Annual Wellness Visit   Patient Care Team: Perri Ronal PARAS, MD as PCP - General (Internal Medicine)  Visit Date: 06/15/24   Chief Complaint  Patient presents with   Annual Exam   Medicare Wellness   Subjective:  Patient: Natalie Parker, Female DOB: [birth year], 73 y.o. MRN: [id number]  Natalie Parker is a 73 y.o. Female who presents today for her Annual Wellness Visit. Patient has Low Back Pain; Spinal Stenosis; Hot Flashes; History Of Smoking; Osteopenia; Bilateral Hip Pain; Depression; and Insomnia.  History of Hyperlipidemia treated with Rosuvastatin  5 mg 3x/ week  . 06/12/2024 Lipid Panel: LDL 103; otherwise WNL.   History of Primary Osteoarthritis, 1st MCP Joint seen by Dr. Camella; Musculoskeletal Pain - Low Back w/ 2015 MRI showing bulging disc and neuro-foraminal involvement L2-S1, s/p Lumbar Surgery 2015 by Dr. Unice and Bilateral Hips - treated with Flexeril  10 mg at bedtime and Hydrocodone  as needed. Followed by Orthopedist, Dr. Oneil Philips.   She also takes Flexeril  for Restless Leg Syndrome.   History of Depression; recently has been dealing with some Situational Stress regarding her brother's condition.  History of Hot Flashes and was previously tried on Effexor, but this caused headaches so was d/c.   Labs 06/12/2024 CBC: MCHC 31.6; otherwise WNL CMP: WNL  TSH: 1.42  Mammogram 04/05/2024 normal with repeat recommendation of 2026.  Colonoscopy 02/27/2024 found Hemorrhoids on perianal exam; multiple Small & Medium-mouthed Diverticula in sigmoid colon and a few Small-mouthed Diverticula in the descending colon; otherwise exam normal with repeat recommendation of 2032.  Bone Density 09/08/2023 T-score Right Femur Neck -0.6, normal.   Vaccine Counseling: Due for Flu, Shingles 1/2, and Tdap; UTD on Flu and PNA. Past Medical History:  Diagnosis Date   Depression    Osteopenia    Spinal stenosis    Medical/Surgical History Narrative:   Allergic/Intolerant to:  E-Mycin/Erythromycin Base - rash   2024 - COVID-19 virus infection treated with Zithromax  Z-PAK and Hycodan cough syrup. Had a Cataract Surgery.    2021 - in June has Melanoma in Situ removed by Dr. Tricia, Dermatologist  2010 - Right Knee Surgery w/ Abrasion Chondroplasty of the Patella, Lateral Tibial Plateau and Medial Femoral Epicondyles. She had Synovectomy of the Suprapatellar Pouch and Medial Joint Right Knee.   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 Used to swim for exercise, but not as physically active anymore.   Review of Systems  Constitutional:  Negative for chills, fever, malaise/fatigue and weight loss.  HENT:  Negative for hearing loss, sinus pain and sore throat.   Respiratory:  Negative for cough, hemoptysis and shortness of breath.   Cardiovascular:  Negative for chest pain, palpitations, leg swelling and PND.  Gastrointestinal:  Negative for abdominal pain, constipation, diarrhea, heartburn, nausea and vomiting.  Genitourinary:  Negative for dysuria, frequency and urgency.  Musculoskeletal:  Negative for back pain, myalgias and neck pain.  Skin:  Negative  for itching and rash.  Neurological:  Negative for dizziness, tingling, seizures and headaches.  Endo/Heme/Allergies:  Negative for polydipsia.  Psychiatric/Behavioral:  Negative for depression. The patient is not nervous/anxious.     Objective:  Vitals: BP 120/80   Pulse 74   Ht 5' 6.75 (1.695 m)   Wt 200 lb (90.7 kg)   SpO2 96%   BMI  31.56 kg/m  Physical Exam Vitals and nursing note reviewed.  Constitutional:      General: She is not in acute distress.    Appearance: Normal appearance. She is not ill-appearing or toxic-appearing.  HENT:     Head: Normocephalic and atraumatic.     Right Ear: Hearing, tympanic membrane, ear canal and external ear normal.     Left Ear: Hearing, tympanic membrane, ear canal and external ear normal.     Mouth/Throat:     Pharynx: Oropharynx is clear.  Eyes:     Extraocular Movements: Extraocular movements intact.     Pupils: Pupils are equal, round, and reactive to light.  Neck:     Thyroid : No thyroid  mass, thyromegaly or thyroid  tenderness.     Vascular: No carotid bruit.  Cardiovascular:     Rate and Rhythm: Normal rate and regular rhythm. No extrasystoles are present.    Pulses:          Dorsalis pedis pulses are 2+ on the right side and 2+ on the left side.     Heart sounds: Normal heart sounds. No murmur heard.    No friction rub. No gallop.  Pulmonary:     Effort: Pulmonary effort is normal.     Breath sounds: Normal breath sounds. No decreased breath sounds, wheezing, rhonchi or rales.  Chest:     Chest wall: No mass.  Abdominal:     Palpations: Abdomen is soft. There is no hepatomegaly, splenomegaly or mass.     Tenderness: There is no abdominal tenderness.     Hernia: No hernia is present.  Genitourinary:    Comments: NFEG. Bimanual normal.  Musculoskeletal:     Cervical back: Normal range of motion.     Right lower leg: No edema.     Left lower leg: No edema.  Lymphadenopathy:     Cervical: No cervical adenopathy.     Upper Body:     Right upper body: No supraclavicular adenopathy.     Left upper body: No supraclavicular adenopathy.  Skin:    General: Skin is warm and dry.  Neurological:     General: No focal deficit present.     Mental Status: She is alert and oriented to person, place, and time. Mental status is at baseline.     Sensory: Sensation is  intact.     Motor: Motor function is intact. No weakness.     Deep Tendon Reflexes: Reflexes are normal and symmetric.  Psychiatric:        Attention and Perception: Attention normal.        Mood and Affect: Mood normal.        Speech: Speech normal.        Behavior: Behavior normal.        Thought Content: Thought content normal.        Cognition and Memory: Cognition normal.        Judgment: Judgment normal.   Most Recent Functional Status Assessment:    06/15/2024   10:36 AM  In your present state of health, do you have any difficulty performing the  following activities:  Hearing? 0  Vision? 0  Difficulty concentrating or making decisions? 1  Walking or climbing stairs? 0  Dressing or bathing? 0  Doing errands, shopping? 0  Preparing Food and eating ? N  Using the Toilet? N  In the past six months, have you accidently leaked urine? N  Do you have problems with loss of bowel control? N  Managing your Medications? N  Managing your Finances? N  Housekeeping or managing your Housekeeping? N   Most Recent Fall Risk Assessment:    06/15/2024   10:37 AM  Fall Risk   Falls in the past year? 0  Number falls in past yr: 0  Injury with Fall? 0  Follow up Falls prevention discussed;Education provided;Falls evaluation completed   Most Recent Depression Screenings:    06/15/2024   10:46 AM 06/10/2023   11:02 AM  PHQ 2/9 Scores  PHQ - 2 Score 0 0   Most Recent Cognitive Screening:    06/15/2024   10:36 AM  6CIT Screen  What Year? 0 points  What month? 0 points  What time? 0 points  Count back from 20 0 points  Months in reverse 0 points  Repeat phrase 0 points  Total Score 0 points   Results:  Studies Obtained And Personally Reviewed By Me:  Mammogram 04/05/2024 normal.  Colonoscopy 02/27/2024 found Hemorrhoids on perianal exam; multiple Small & Medium-mouthed Diverticula in sigmoid colon and a few Small-mouthed Diverticula in the descending colon; otherwise exam  normal.  Bone Density 09/08/2023 T-score Right Femur Neck -0.6, normal.   Labs:     Component Value Date/Time   NA 142 06/12/2024 0949   K 4.5 06/12/2024 0949   CL 104 06/12/2024 0949   CO2 29 06/12/2024 0949   GLUCOSE 96 06/12/2024 0949   BUN 22 06/12/2024 0949   CREATININE 0.60 06/12/2024 0949   CALCIUM  9.7 06/12/2024 0949   PROT 7.0 06/12/2024 0949   ALBUMIN 4.3 12/31/2016 1043   AST 20 06/12/2024 0949   ALT 15 06/12/2024 0949   ALKPHOS 41 12/31/2016 1043   BILITOT 0.4 06/12/2024 0949   GFRNONAA 92 03/26/2021 0931   GFRAA 106 03/26/2021 0931    Lab Results  Component Value Date   WBC 5.7 06/12/2024   HGB 13.1 06/12/2024   HCT 41.4 06/12/2024   MCV 98.3 06/12/2024   PLT 252 06/12/2024   Lab Results  Component Value Date   CHOL 183 06/12/2024   HDL 62 06/12/2024   LDLCALC 103 (H) 06/12/2024   TRIG 90 06/12/2024   CHOLHDL 3.0 06/12/2024   Lab Results  Component Value Date   TSH 1.42 06/12/2024    Assessment & Plan:   Orders Placed This Encounter  Procedures   POCT URINALYSIS DIP (CLINITEK)   Meds ordered this encounter  Medications   ALPRAZolam  (XANAX ) 0.5 MG tablet    Sig: Take 1 tablet (0.5 mg total) by mouth 2 (two) times daily as needed for anxiety.    Dispense:  60 tablet    Refill:  0  Other Labs Reviewed today: CBC: MCHC 31.6; otherwise WNL CMP: WNL  TSH: 1.42  Hyperlipidemia treated with Rosuvastatin  5 mg 3x/ week  . 06/12/2024 Lipid Panel: LDL 103; otherwise WNL.   Primary Osteoarthritis, 1st MCP Joint seen by Dr. Camella.  Musculoskeletal Pain - Low Back w/ 2015 MRI showing bulging disc and neuro-foraminal involvement L2-S1, s/p Lumbar Surgery 2015 by Dr. Unice and Bilateral Hips - treated with Flexeril  10 mg  at bedtime and Hydrocodone  as needed. Followed by Orthopedist, Dr. Oneil Philips.   She also takes Flexeril  for Restless Leg Syndrome.    Anxiety and Situational stress Recently has been dealing with some Situational Stress regarding  her brother's condition. Sending in Xanax  0.5 mg to take twice daily as needed.   History of Hot Flashes and was previously tried on Effexor, but this caused headaches so was d/c.   Mammogram 04/05/2024 normal with repeat recommendation of 2026.  Colonoscopy 02/27/2024 found Hemorrhoids on perianal exam; multiple Small & Medium-mouthed Diverticula in sigmoid colon and a few Small-mouthed Diverticula in the descending colon; otherwise exam normal with repeat recommendation of 2032.  Bone Density 09/08/2023 T-score Right Femur Neck -0.6, normal.   Vaccine Counseling: Due for Flu, Shingles 1/2, and Tdap; UTD on Flu and PNA.  Return in 1 year (on 06/18/2025) for annual labs and then on 06/21/2025 for annual visit, or as needed.   Annual wellness visit done today including the all of the following: Reviewed patient's Family Medical History Reviewed and updated list of patient's medical providers Assessment of cognitive impairment was done Assessed patient's functional ability Established a written schedule for health screening services Health Risk Assessent Completed and Reviewed  Discussed health benefits of physical activity, and encouraged her to engage in regular exercise appropriate for her age and condition.    I,Emily Lagle,acting as a Neurosurgeon for Ronal JINNY Hailstone, MD.,have documented all relevant documentation on the behalf of Ronal JINNY Hailstone, MD,as directed by  Ronal JINNY Hailstone, MD while in the presence of Ronal JINNY Hailstone, MD.   I, Ronal JINNY Hailstone, MD, have reviewed all documentation for this visit. The documentation on 06/24/24 for the exam, diagnosis, procedures, and orders are all accurate and complete.

## 2024-06-15 NOTE — Progress Notes (Signed)
 Subjective:   Natalie Parker is a 73 y.o. female who presents for Medicare Annual (Subsequent) preventive examination.  Visit Complete: In person  Patient Medicare AWV questionnaire was completed by the patient on 06/15/2024; I have confirmed that all information answered by patient is correct and no changes since this date.  Cardiac Risk Factors include: advanced age (>10men, >61 women);obesity (BMI >30kg/m2)     Objective:    Today's Vitals   06/15/24 1041 06/15/24 1048  BP: 120/80   Pulse: 74   SpO2: 96%   Weight: 200 lb (90.7 kg)   Height: 5' 6.75 (1.695 m)   PainSc: 2  0-No pain  PainLoc: Back    Body mass index is 31.56 kg/m.     06/10/2023   11:06 AM 05/24/2022   11:01 AM 08/13/2019    7:02 PM 08/13/2019    9:37 AM  Advanced Directives  Does Patient Have a Medical Advance Directive? No No No No  Would patient like information on creating a medical advance directive? No - Patient declined Yes (MAU/Ambulatory/Procedural Areas - Information given) Yes (MAU/Ambulatory/Procedural Areas - Information given)     Current Medications (verified) Outpatient Encounter Medications as of 06/15/2024  Medication Sig   Calcium  Carb-Cholecalciferol (CALCIUM  1000 + D) 1000-20 MG-MCG TABS Take 1,000 mg by mouth daily with supper.   chlorpheniramine-HYDROcodone  (TUSSIONEX) 10-8 MG/5ML Take 5 mLs by mouth every 12 (twelve) hours as needed for cough.   cholecalciferol (VITAMIN D ) 1000 UNITS tablet Take 2,000 Units by mouth daily.   cyclobenzaprine  (FLEXERIL ) 10 MG tablet Take 10 mg by mouth at bedtime.   fluticasone  (FLONASE ) 50 MCG/ACT nasal spray Place 2 sprays into both nostrils daily.   Multiple Vitamin (MULTIVITAMIN) tablet Take 1 tablet by mouth daily.   naproxen sodium (ALEVE) 220 MG tablet Take 220 mg by mouth 2 (two) times daily as needed.   Omega-3 Fatty Acids (FISH OIL) 1000 MG CAPS Take by mouth.   ondansetron  (ZOFRAN ) 4 MG tablet Take 1 tablet (4 mg total) by mouth every 8  (eight) hours as needed for nausea or vomiting.   rosuvastatin  (CRESTOR ) 5 MG tablet TAKE 1 TABLET BY MOUTH THREE TIMES A WEEK AS DIRECTED WITH SUPPER   TURMERIC PO Take by mouth.   Vitamin D -Vitamin K (VITAMIN K2-VITAMIN D3 PO) Take by mouth.   [DISCONTINUED] Cranberry 125 MG TABS Take by mouth.   [DISCONTINUED] doxycycline  (VIBRA -TABS) 100 MG tablet Take 1 tablet (100 mg total) by mouth 2 (two) times daily.   [DISCONTINUED] HYDROcodone -acetaminophen  (NORCO/VICODIN) 5-325 MG tablet Take 1 tablet by mouth 2 (two) times daily.   [DISCONTINUED] methylPREDNISolone  (MEDROL ) 4 MG tablet Take in tapering course as directed 6-5-4-3-2-1   [DISCONTINUED] rosuvastatin  (CRESTOR ) 5 MG tablet TAKE 1 TABLET BY MOUTH THREE TIMES A WEEK AS DIRECTED WITH SUPPER   No facility-administered encounter medications on file as of 06/15/2024.    Allergies (verified) E-mycin [erythromycin base]   History: Past Medical History:  Diagnosis Date   Depression    Osteopenia    Spinal stenosis    Past Surgical History:  Procedure Laterality Date   repair torn meniscus     TONSILLECTOMY     Family History  Problem Relation Age of Onset   Other Mother    Brain cancer Father    Healthy Brother    Healthy Brother    BRCA 1/2 Neg Hx    Breast cancer Neg Hx    Social History   Socioeconomic History  Marital status: Married    Spouse name: Not on file   Number of children: Not on file   Years of education: Not on file   Highest education level: Associate degree: occupational, Scientist, product/process development, or vocational program  Occupational History   Not on file  Tobacco Use   Smoking status: Former    Current packs/day: 0.00    Average packs/day: 0.5 packs/day for 20.0 years (10.0 ttl pk-yrs)    Types: Cigarettes    Start date: 02/29/1992    Quit date: 02/29/2012    Years since quitting: 12.3   Smokeless tobacco: Never  Substance and Sexual Activity   Alcohol use: Yes    Comment: occasional   Drug use: No   Sexual  activity: Yes  Other Topics Concern   Not on file  Social History Narrative   Not on file   Social Drivers of Health   Financial Resource Strain: Low Risk  (06/12/2024)   Overall Financial Resource Strain (CARDIA)    Difficulty of Paying Living Expenses: Not hard at all  Food Insecurity: No Food Insecurity (06/12/2024)   Hunger Vital Sign    Worried About Running Out of Food in the Last Year: Never true    Ran Out of Food in the Last Year: Never true  Transportation Needs: No Transportation Needs (06/12/2024)   PRAPARE - Administrator, Civil Service (Medical): No    Lack of Transportation (Non-Medical): No  Physical Activity: Sufficiently Active (06/12/2024)   Exercise Vital Sign    Days of Exercise per Week: 3 days    Minutes of Exercise per Session: 90 min  Stress: No Stress Concern Present (06/12/2024)   [employer] of Occupational Health - Occupational Stress Questionnaire    Feeling of Stress: Only a little  Social Connections: Socially Integrated (06/12/2024)   Social Connection and Isolation Panel    Frequency of Communication with Friends and Family: More than three times a week    Frequency of Social Gatherings with Friends and Family: Three times a week    Attends Religious Services: More than 4 times per year    Active Member of Clubs or Organizations: Yes    Attends Engineer, structural: More than 4 times per year    Marital Status: Married    Tobacco Counseling Counseling given: Not Answered   Clinical Intake:  Pre-visit preparation completed: Yes  Pain : No/denies pain Pain Score: 0-No pain     BMI - recorded: 31.56 Nutritional Status: BMI > 30  Obese Diabetes: No  How often do you need to have someone help you when you read instructions, pamphlets, or other written materials from your doctor or pharmacy?: 1 - Never  Interpreter Needed?: No  Information entered by :: Kathlynn Porto, CMA   Activities of Daily Living     06/15/2024   10:36 AM 06/12/2024    5:07 PM  In your present state of health, do you have any difficulty performing the following activities:  Hearing? 0 0  Vision? 0 0  Difficulty concentrating or making decisions? 1 1  Walking or climbing stairs? 0 0  Dressing or bathing? 0 0  Doing errands, shopping? 0 0  Preparing Food and eating ? N N  Using the Toilet? N N  In the past six months, have you accidently leaked urine? N N  Do you have problems with loss of bowel control? N N  Managing your Medications? N N  Managing your Finances? N N  Housekeeping or managing your Housekeeping? N N    Patient Care Team: Perri Ronal PARAS, MD as PCP - General (Internal Medicine)  Indicate any recent Medical Services you may have received from other than [facility] providers in the past year (date may be approximate).     Assessment:   This is a routine wellness examination for Natalie Parker.  Hearing/Vision screen No results found.   Goals Addressed   None    Depression Screen    06/15/2024   10:46 AM 06/10/2023   11:02 AM 12/03/2022   11:22 AM 05/24/2022   11:01 AM 03/30/2021    2:16 PM 03/28/2020   11:06 AM 02/12/2019   10:05 AM  PHQ 2/9 Scores  PHQ - 2 Score 0 0 0 0 0 0 0    Fall Risk    06/15/2024   10:37 AM 06/12/2024    5:07 PM 03/01/2024   11:28 AM 06/10/2023   11:05 AM 12/03/2022   11:22 AM  Fall Risk   Falls in the past year? 0 0 0 0 0  Number falls in past yr: 0 0 0 0 0  Injury with Fall? 0 0 0 0 0  Risk for fall due to :    No Fall Risks No Fall Risks  Follow up Falls prevention discussed;Education provided;Falls evaluation completed  Falls evaluation completed Falls evaluation completed;Education provided;Falls prevention discussed Falls prevention discussed      Data saved with a previous flowsheet row definition    MEDICARE RISK AT HOME: Medicare Risk at Home Any stairs in or around the home?: Yes If so, are there any without handrails?: No Home free of loose throw rugs in walkways,  pet beds, electrical cords, etc?: Yes Adequate lighting in your home to reduce risk of falls?: Yes Life alert?: No Use of a cane, walker or w/c?: No Grab bars in the bathroom?: Yes Shower chair or bench in shower?: Yes Elevated toilet seat or a handicapped toilet?: No  TIMED UP AND GO:  Was the test performed?  No    Cognitive Function:        06/15/2024   10:36 AM 06/10/2023   11:08 AM 05/24/2022   11:02 AM  6CIT Screen  What Year? 0 points 0 points 0 points  What month? 0 points 0 points 0 points  What time? 0 points 0 points 0 points  Count back from 20 0 points 0 points 0 points  Months in reverse 0 points 0 points 0 points  Repeat phrase 0 points 0 points 0 points  Total Score 0 points 0 points 0 points    Immunizations Immunization History  Administered Date(s) Administered   Influenza Split 09/21/2011, 10/09/2012   Influenza Whole 09/14/2010   Influenza, High Dose Seasonal PF 09/15/2023   Influenza,inj,Quad PF,6+ Mos 10/29/2013, 11/18/2014, 12/12/2015, 01/04/2017, 02/09/2018, 08/10/2018, 09/12/2019, 09/10/2020   Influenza-Unspecified 08/25/2021, 09/12/2022   Moderna Covid-19 Vaccine Bivalent Booster 55yrs & up 10/17/2021   Moderna Sars-Covid-2 Vaccination 01/01/2020, 01/31/2020, 09/26/2020, 03/29/2021   PNEUMOCOCCAL CONJUGATE-20 06/10/2023   Pneumococcal Conjugate-13 02/17/2018   Pneumococcal Polysaccharide-23 02/12/2019   Td 08/30/1994   Tdap 09/21/2011    TDAP status: Due, Education has been provided regarding the importance of this vaccine. Advised may receive this vaccine at local pharmacy or Health Dept. Aware to provide a copy of the vaccination record if obtained from local pharmacy or Health Dept. Verbalized acceptance and understanding.  Flu Vaccine status: Up to date  Pneumococcal vaccine status: Up to date  Covid-19 vaccine status: Information provided on how to obtain vaccines.   Qualifies for Shingles Vaccine? Yes   Zostavax completed No    Shingrix Completed?: No.    Education has been provided regarding the importance of this vaccine. Patient has been advised to call insurance company to determine out of pocket expense if they have not yet received this vaccine. Advised may also receive vaccine at local pharmacy or Health Dept. Verbalized acceptance and understanding.  Screening Tests Health Maintenance  Topic Date Due   COVID-19 Vaccine (6 - 2024-25 season) 07/01/2024 (Originally 07/31/2023)   DTaP/Tdap/Td (3 - Td or Tdap) 11/28/2024 (Originally 09/20/2021)   Zoster Vaccines- Shingrix (1 of 2) 11/28/2024 (Originally 02/05/1970)   INFLUENZA VACCINE  06/29/2024   MAMMOGRAM  04/05/2025   Medicare Annual Wellness (AWV)  06/12/2025   Colonoscopy  02/26/2034   Pneumococcal Vaccine: 50+ Years  Completed   DEXA SCAN  Completed   Hepatitis B Vaccines  Aged Out   HPV VACCINES  Aged Out   Meningococcal B Vaccine  Aged Out   Hepatitis C Screening  Discontinued    Health Maintenance  There are no preventive care reminders to display for this patient.  Colorectal cancer screening: Type of screening: Colonoscopy. Completed 02/27/2024. Repeat every 10 years  Mammogram status: Completed 02/27/2024. Repeat every year  Bone Density status: Completed 09/08/2023. Results reflect: Bone density results: NORMAL. Repeat every 2 years.  Lung Cancer Screening: (Low Dose CT Chest recommended if Age 56-80 years, 20 pack-year currently smoking OR have quit w/in 15years.) does not qualify.    Additional Screening:  Hepatitis C Screening: does not qualify; Completed   Vision Screening: Recommended annual ophthalmology exams for early detection of glaucoma and other disorders of the eye. Is the patient up to date with their annual eye exam?  Yes  Who is the provider or what is the name of the office in which the patient attends annual eye exams? [facility] If pt is not established with a provider, would they like to be referred  to a provider to establish care? No .   Dental Screening: Recommended annual dental exams for proper oral hygiene   Community Resource Referral / Chronic Care Management: CRR required this visit?  No   CCM required this visit?  No     Plan:     I have personally reviewed and noted the following in the patient's chart:   Medical and social history Use of alcohol, tobacco or illicit drugs  Current medications and supplements including opioid prescriptions. Patient is not currently taking opioid prescriptions. Functional ability and status Nutritional status Physical activity Advanced directives List of other physicians Hospitalizations, surgeries, and ER visits in previous 12 months Vitals Screenings to include cognitive, depression, and falls Referrals and appointments  In addition, I have reviewed and discussed with patient certain preventive protocols, quality metrics, and best practice recommendations. A written personalized care plan for preventive services as well as general preventive health recommendations were provided to patient.     Geneva Pallas Zelda, CMA   06/15/2024   After Visit Summary: (In Person-Printed) AVS printed and given to the patient

## 2024-06-24 NOTE — Patient Instructions (Addendum)
 It was a pleasure to see you today. Your Medicare wellness visit will be done at a later time.You may take Xanax  sparingly up to 2 times daily for situational stress and anxiety. . Continue rosuvastatin  for lipid management. Return in one year or as needed.

## 2024-06-28 DIAGNOSIS — G894 Chronic pain syndrome: Secondary | ICD-10-CM | POA: Diagnosis not present

## 2024-06-28 DIAGNOSIS — M47816 Spondylosis without myelopathy or radiculopathy, lumbar region: Secondary | ICD-10-CM | POA: Diagnosis not present

## 2024-06-28 DIAGNOSIS — M150 Primary generalized (osteo)arthritis: Secondary | ICD-10-CM | POA: Diagnosis not present

## 2024-06-28 DIAGNOSIS — Z79891 Long term (current) use of opiate analgesic: Secondary | ICD-10-CM | POA: Diagnosis not present

## 2024-07-05 DIAGNOSIS — H5211 Myopia, right eye: Secondary | ICD-10-CM | POA: Diagnosis not present

## 2024-07-05 DIAGNOSIS — Z961 Presence of intraocular lens: Secondary | ICD-10-CM | POA: Diagnosis not present

## 2024-07-05 DIAGNOSIS — H16223 Keratoconjunctivitis sicca, not specified as Sjogren's, bilateral: Secondary | ICD-10-CM | POA: Diagnosis not present

## 2024-07-05 DIAGNOSIS — H524 Presbyopia: Secondary | ICD-10-CM | POA: Diagnosis not present

## 2024-08-23 DIAGNOSIS — I781 Nevus, non-neoplastic: Secondary | ICD-10-CM | POA: Diagnosis not present

## 2024-08-23 DIAGNOSIS — Z86006 Personal history of melanoma in-situ: Secondary | ICD-10-CM | POA: Diagnosis not present

## 2024-08-23 DIAGNOSIS — L570 Actinic keratosis: Secondary | ICD-10-CM | POA: Diagnosis not present

## 2024-08-23 DIAGNOSIS — L578 Other skin changes due to chronic exposure to nonionizing radiation: Secondary | ICD-10-CM | POA: Diagnosis not present

## 2024-08-23 DIAGNOSIS — D1801 Hemangioma of skin and subcutaneous tissue: Secondary | ICD-10-CM | POA: Diagnosis not present

## 2024-08-23 DIAGNOSIS — L821 Other seborrheic keratosis: Secondary | ICD-10-CM | POA: Diagnosis not present

## 2024-08-23 DIAGNOSIS — L820 Inflamed seborrheic keratosis: Secondary | ICD-10-CM | POA: Diagnosis not present

## 2024-08-23 DIAGNOSIS — D229 Melanocytic nevi, unspecified: Secondary | ICD-10-CM | POA: Diagnosis not present

## 2024-08-23 DIAGNOSIS — L814 Other melanin hyperpigmentation: Secondary | ICD-10-CM | POA: Diagnosis not present

## 2024-09-02 ENCOUNTER — Other Ambulatory Visit: Payer: Self-pay | Admitting: Internal Medicine

## 2024-09-20 DIAGNOSIS — Z79891 Long term (current) use of opiate analgesic: Secondary | ICD-10-CM | POA: Diagnosis not present

## 2024-09-20 DIAGNOSIS — G894 Chronic pain syndrome: Secondary | ICD-10-CM | POA: Diagnosis not present

## 2024-09-20 DIAGNOSIS — M150 Primary generalized (osteo)arthritis: Secondary | ICD-10-CM | POA: Diagnosis not present

## 2024-09-20 DIAGNOSIS — M47816 Spondylosis without myelopathy or radiculopathy, lumbar region: Secondary | ICD-10-CM | POA: Diagnosis not present

## 2025-06-18 ENCOUNTER — Other Ambulatory Visit: Payer: Self-pay

## 2025-06-21 ENCOUNTER — Ambulatory Visit: Payer: Self-pay | Admitting: Internal Medicine
